# Patient Record
Sex: Female | Born: 1960 | Race: White | Hispanic: No | Marital: Married | Smoking: Never smoker
Health system: Southern US, Community
[De-identification: ages and names within clinical notes are randomized; demographics above are authoritative.]

## PROBLEM LIST (undated history)

## (undated) DIAGNOSIS — T7840XA Allergy, unspecified, initial encounter: Secondary | ICD-10-CM

## (undated) DIAGNOSIS — Z973 Presence of spectacles and contact lenses: Secondary | ICD-10-CM

## (undated) DIAGNOSIS — E079 Disorder of thyroid, unspecified: Secondary | ICD-10-CM

## (undated) DIAGNOSIS — E785 Hyperlipidemia, unspecified: Secondary | ICD-10-CM

## (undated) DIAGNOSIS — E039 Hypothyroidism, unspecified: Secondary | ICD-10-CM

## (undated) DIAGNOSIS — E119 Type 2 diabetes mellitus without complications: Secondary | ICD-10-CM

## (undated) DIAGNOSIS — I10 Essential (primary) hypertension: Secondary | ICD-10-CM

## (undated) HISTORY — DX: Hyperlipidemia, unspecified: E78.5

## (undated) HISTORY — PX: CHOLECYSTECTOMY: SHX55

## (undated) HISTORY — DX: Disorder of thyroid, unspecified: E07.9

## (undated) HISTORY — DX: Allergy, unspecified, initial encounter: T78.40XA

## (undated) HISTORY — PX: DILATION AND CURETTAGE OF UTERUS: SHX78

## (undated) HISTORY — PX: OTHER SURGICAL HISTORY: SHX169

## (undated) HISTORY — DX: Essential (primary) hypertension: I10

## (undated) HISTORY — DX: Type 2 diabetes mellitus without complications: E11.9

---

## 2005-08-13 ENCOUNTER — Ambulatory Visit (HOSPITAL_COMMUNITY): Admission: RE | Admit: 2005-08-13 | Discharge: 2005-08-13 | Payer: Self-pay | Admitting: Surgery

## 2005-10-15 ENCOUNTER — Ambulatory Visit (HOSPITAL_COMMUNITY): Admission: RE | Admit: 2005-10-15 | Discharge: 2005-10-16 | Payer: Self-pay | Admitting: Surgery

## 2006-05-01 IMAGING — NM NM PARATHYOID PLANAR
3 series · 18 of 18 positions shown · non-contrast
Comparison: None.

CLINICAL DATA: Hyperthyroidism.  
 NM PARATHYROID SCINTIGRAPHY AND SPECT IMAGING ? 08/13/05:
TECHNIQUE: Following intravenous administration of radiopharmaceutical, early and 2-hour delayed planar images were obtained in the anterior projection.  Delayed triplanar SPECT images were also obtained at 2 hours.  
 Radiopharmaceutical:  24.6 mCi [id number] sestamibi IV.

[Series 1: (hospital) non-circular ect · 4.7mm · 4.75mm/px · 6 of 91 frames shown (1 of 3)]
[frame 8/91]
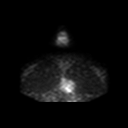
[frame 23/91]
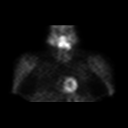
[frame 38/91]
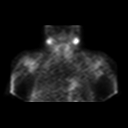
[frame 53/91]
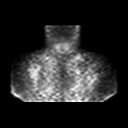
[frame 68/91]
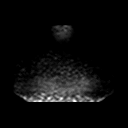
[frame 84/91]
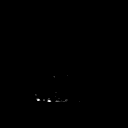

[Series 1: (hospital) non-circular ect · 4.7mm · 4.75mm/px · 6 of 91 frames shown (2 of 3)]
[frame 8/91]
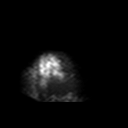
[frame 23/91]
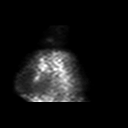
[frame 38/91]
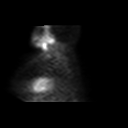
[frame 53/91]
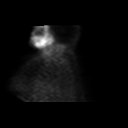
[frame 68/91]
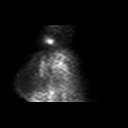
[frame 84/91]
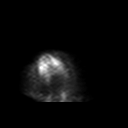

[Series 1: (hospital) non-circular ect · 4.7mm · 4.75mm/px · 6 of 91 frames shown (3 of 3)]
[frame 8/91]
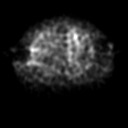
[frame 23/91]
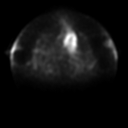
[frame 38/91]
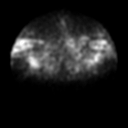
[frame 53/91]
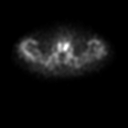
[frame 68/91]
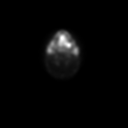
[frame 84/91]
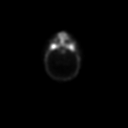

[18 of 18 positions shown; findings below may reference images not displayed]

FINDINGS: Delayed triplanar SPECT images show a persistent focus of increased radiotracer uptake corresponding to the lower pole of the left lobe of the thyroid gland.  The finding is consistent with a parathyroid adenoma.
IMPRESSION: Parathyroid adenoma localizes to the lower pole of the left lobe of the thyroid gland.

## 2012-10-14 ENCOUNTER — Other Ambulatory Visit (HOSPITAL_COMMUNITY)
Admission: RE | Admit: 2012-10-14 | Discharge: 2012-10-14 | Disposition: A | Discharge status: Home / Self Care | Payer: PRIVATE HEALTH INSURANCE | Source: Ambulatory Visit | Attending: Obstetrics and Gynecology | Admitting: Obstetrics and Gynecology

## 2012-10-14 ENCOUNTER — Other Ambulatory Visit: Payer: Self-pay | Admitting: Obstetrics and Gynecology

## 2012-10-14 DIAGNOSIS — Z1151 Encounter for screening for human papillomavirus (HPV): Secondary | ICD-10-CM | POA: Insufficient documentation

## 2012-10-14 DIAGNOSIS — Z01419 Encounter for gynecological examination (general) (routine) without abnormal findings: Secondary | ICD-10-CM | POA: Insufficient documentation

## 2016-12-31 ENCOUNTER — Other Ambulatory Visit: Payer: Self-pay | Admitting: Obstetrics and Gynecology

## 2016-12-31 ENCOUNTER — Ambulatory Visit (INDEPENDENT_AMBULATORY_CARE_PROVIDER_SITE_OTHER): Payer: PRIVATE HEALTH INSURANCE | Admitting: Endocrinology

## 2016-12-31 ENCOUNTER — Encounter: Payer: Self-pay | Admitting: Endocrinology

## 2016-12-31 ENCOUNTER — Other Ambulatory Visit (HOSPITAL_COMMUNITY)
Admission: RE | Admit: 2016-12-31 | Discharge: 2016-12-31 | Disposition: A | Discharge status: Home / Self Care | Payer: PRIVATE HEALTH INSURANCE | Source: Ambulatory Visit | Attending: Obstetrics and Gynecology | Admitting: Obstetrics and Gynecology

## 2016-12-31 VITALS — BP 138/76 | HR 101 | Ht 63.0 in | Wt 241.4 lb

## 2016-12-31 DIAGNOSIS — E1165 Type 2 diabetes mellitus with hyperglycemia: Secondary | ICD-10-CM

## 2016-12-31 DIAGNOSIS — Z794 Long term (current) use of insulin: Secondary | ICD-10-CM | POA: Insufficient documentation

## 2016-12-31 DIAGNOSIS — Z124 Encounter for screening for malignant neoplasm of cervix: Secondary | ICD-10-CM | POA: Diagnosis present

## 2016-12-31 LAB — HM DIABETES EYE EXAM

## 2016-12-31 NOTE — Progress Notes (Signed)
Patient ID: Kara Dawson, female   DOB: 08/13/1960, 56 y.o.   MRN: [id number]          Reason for Appointment: Consultation for Type 2 Diabetes  Referring physician: Marisue Humble   History of Present Illness:          Date of diagnosis of type 2 diabetes mellitus: 2004        Background history:   She has been on various oral hyperglycemic agents including metformin, Janumet and Amaryl in the past Apparently her blood sugars were fairly well controlled until 2012 when her A1c was 7.5 and subsequently it has been mostly higher above 8% She has tried other agents including Invokana and Farxiga as well as Bydureon However her A1c has not improved with these changes She apparently had not lost weight with Bydureon and blood sugars were not better  Recent history:       Her most recent A1c was 9.3 in April  Non-insulin hypoglycemic drugs the patient is taking are: Janumet 50/1000 twice a day, Amaryl 4 mg twice a day, Farxiga 10 mg daily  Current management, blood sugar patterns and problems identified:  She does not check her sugar very regularly and did not bring her meter  She thinks that her blood sugars are the highest fasting, mostly 160-180 but occasionally over 200  Not clear if she checks readings after meals much but she does not think they are more than about 140 at night before bedtime  Over the last 2 months or so she has been eating out frequently and not watching her diet  Also has not been doing any walking for exercise except occasionally  She has had difficulty losing weight and has been about the same for some time, she has been recommended bariatric surgery but she does not want to consider this           Side effects from medications have been: None  Compliance with the medical regimen: Fair  Glucose monitoring:  done 0-1  times a day         Glucometer: One Touch.       Blood Glucose readings by recall   PREMEAL Breakfast Lunch Dinner Bedtime  Overall     Glucose range: 160-209 100 100 140   Median:        POST-MEAL PC Breakfast PC Lunch PC Dinner  Glucose range:     Median:      Self-care: The diet that the patient has been following is: tries to limit Sugar intake .  Recently eating out more, eating out 3-5 times a week at diners and sometimes seafood restaurants     Typical meal intake: Breakfast is protein shake or egg and toast               Dietician visit, most recent: 2013               Exercise:  some walking  Weight history: Previous range 180-281  Wt Readings from Last 3 Encounters:  12/31/16 241 lb 6.4 oz (109.5 kg)    Glycemic control:   No results found for: HGBA1C No results found for: GLUF, MICROALBUR, LDLCALC, CREATININE No results found for: MICRALBCREAT  No results found for: FRUCTOSAMINE   Other active problems: See review of systems   Allergies as of 12/31/2016      Reactions   Penicillins Rash      Medication List       Accurate as of 12/31/16  1:07 PM. Always use your most recent med list.          aspirin EC 81 MG tablet Take 81 mg by mouth daily.   atorvastatin 40 MG tablet Commonly known as:  LIPITOR Take 40 mg by mouth daily.   FARXIGA 10 MG Tabs tablet Generic drug:  dapagliflozin propanediol Take 1 Tablet by mouth once daily FOR diabetes   glimepiride 4 MG tablet Commonly known as:  AMARYL Take 1 Tablet by mouth 2 times a day WITH FOOD FOR diabetes   JANUMET 50-1000 MG tablet Generic drug:  sitaGLIPtin-metformin Take 1 Tablet by mouth 2 times a day FOR diabetes   L-Lysine 500 MG Caps Take by mouth. Takes one daily   levothyroxine 50 MCG tablet Commonly known as:  SYNTHROID, LEVOTHROID Take 1 Tablet by mouth every morning ON an EMPTY stomach   lisinopril 10 MG tablet Commonly known as:  PRINIVIL,ZESTRIL Take 10 mg by mouth daily.   magnesium gluconate 500 MG tablet Commonly known as:  MAGONATE Take 500 mg by mouth daily.   MULTIPLE VITAMIN PO Take by mouth.  Takes one vitamin daily   Vitamin D3 1000 units Caps Take by mouth. Takes one daily       Allergies:  Allergies  Allergen Reactions  . Penicillins Rash    Past Medical History:  Diagnosis Date  . Allergy   . Diabetes mellitus without complication ([place])   . Hyperlipidemia   . Hypertension   . Thyroid disease     No past surgical history on file.  Family History  Problem Relation Age of Onset  . Diabetes Mother   . Diabetes Father   . Heart disease Father     Social History:  reports that she has never smoked. She has never used smokeless tobacco. Her alcohol and drug histories are not on file.   Review of Systems  Constitutional: Negative for reduced appetite.       Has lost about 4 pounds since April  HENT: Negative for trouble swallowing.   Eyes: Negative for blurred vision.  Respiratory: Negative for shortness of breath.   Cardiovascular: Negative for chest pain and leg swelling.  Gastrointestinal: Positive for nausea. Negative for constipation and diarrhea.       Sometimes will have transient nausea in the mornings before eating  Endocrine: Negative for fatigue.       She has been on levothyroxine since 2007 when she had parathyroidectomy for hyperparathyroidism  Genitourinary: Negative for frequency.       Has to get up at night for urination only about once  Musculoskeletal: Negative for back pain.       She has been running down her right leg from the buttock area to the foot on sitting in certain positions and sometimes lying down but not on walking  Skin: Negative for rash.  Neurological: Negative for numbness and tingling.     Lipid history: Last LDL was 92, she has been treated with Lipitor   No results found for: CHOL, HDL, LDLCALC, LDLDIRECT, TRIG, CHOLHDL         Hypertension: Mild and has been treated with lisinopril by PCP  Most recent eye exam was 5/17  Most recent foot exam: 12/2016     LABS: Last TSH in April was 2.2  No visits  with results within 1 Week(s) from this visit.  Latest known visit with results is:  No results found for any previous visit.    Physical Examination:  BP 138/76   Pulse (!) 101   Ht '5\' 3"'  (1.6 m)   Wt 241 lb 6.4 oz (109.5 kg)   SpO2 94%   BMI 42.76 kg/m   GENERAL:         Patient has generalized obesity.   HEENT:         Eye exam shows normal external appearance. Fundus exam shows no retinopathy.  Oral exam shows normal mucosa .  NECK:   There is no lymphadenopathy Thyroid is not enlarged and no nodules felt.  Carotids are normal to palpation and no bruit heard LUNGS:         Chest is symmetrical. Lungs are clear to auscultation.Marland Kitchen   HEART:         Heart sounds:  S1 and S2 are normal. No murmur or click heard., no S3 or S4.   ABDOMEN:   There is no distention present. Liver and spleen are not palpable. No other mass or tenderness present.   NEUROLOGICAL:   Ankle jerks are absent bilaterally.    Diabetic Foot Exam - Simple   Simple Foot Form Diabetic Foot exam was performed with the following findings:  Yes   Visual Inspection No deformities, no ulcerations, no other skin breakdown bilaterally:  Yes Sensation Testing Intact to touch and monofilament testing bilaterally:  Yes Pulse Check See comments:  Yes Comments Decreased pulses on the right and absent on the left            Vibration sense is Mildly reduced in distal first toes. MUSCULOSKELETAL:  There is no swelling or deformity of the peripheral joints. Spine is normal to inspection.   EXTREMITIES:     There is no edema. No skin lesions present.Marland Kitchen SKIN:       No rash or lesions of concern.        ASSESSMENT:  Diabetes type 2, uncontrolled with A1c recently consistently over 9% BMI 43 and patient has difficulty losing weight Patient reports only mildly increased blood sugars fasting and not at other times and unlikely is checking her blood sugar as regularly as needed She probably is insulin deficient because of her  progressive hyperglycemia which has not improved over the last 6 years with various agents including Bydureon, [place] and [place] More recently has not been doing well with her diet or exercise regimen also  Complications of diabetes: None evident  Hyperlipidemia, hypertension, hypothyroidism: Well controlled and followed by PCP   PLAN:     Although she most likely needs basal insulin to improve her control she may be able to get better control and weight loss with trying a GLP-1 drug.  Since Ozempic will be the most effective GLP-1 drug she will be tried on this using the starter kit with the co-pay card provided by the company  Showed her how to use the [facility] pen today and given her the patient information and co-pay card  She will start with 0.25 mg weekly and then go to 0.5 after 2 injections  Discussed actions and benefits of GLP-1 drugs, side effects mostly related to nausea initially  When she finishes her Janumet she can switch to metformin 2000 mg a day  Follow-up in 6 weeks  If she is not improving with her blood sugar and fructosamine levels at that time she will be given basal insulin  She will be scheduled to see the dietitian for meal planning  She will start regular walking for exercise up to 30 minutes a day  More consistent glucose monitoring at various times including after meals and bring monitor for download on each visit  Patient Instructions  Take Ozempic 0.56m weekly for 2 weeks then 0.523mweekly  Check blood sugars on waking up  3/7 days  Also check blood sugars about 2 hours after a meal and do this after different meals by rotation  Recommended blood sugar levels on waking up is 90-130 and about 2 hours after meal is 130-160  Please bring your blood sugar monitor to each visit, thank you     Consultation note has been sent to the referring physician  [facility]/14/2018, 1:07 PM   Note: This office note was prepared with Dragon voice  recognition system technology. Any transcriptional errors that result from this process are unintentional.

## 2016-12-31 NOTE — Patient Instructions (Signed)
Take Ozempic 0.25mg  weekly for 2 weeks then 0.56mg  weekly  Check blood sugars on waking up  3/7 days  Also check blood sugars about 2 hours after a meal and do this after different meals by rotation  Recommended blood sugar levels on waking up is 90-130 and about 2 hours after meal is 130-160  Please bring your blood sugar monitor to each visit, thank you

## 2017-01-06 LAB — CYTOLOGY - PAP
Diagnosis: NEGATIVE
HPV: NOT DETECTED

## 2017-01-14 ENCOUNTER — Telehealth: Payer: Self-pay | Admitting: Family Medicine

## 2017-01-14 NOTE — Telephone Encounter (Signed)
Pharm tech calling to get PA for Farxiga 10mg .  Thank you,  -LL

## 2017-01-18 ENCOUNTER — Other Ambulatory Visit: Payer: Self-pay

## 2017-01-18 MED ORDER — FARXIGA 10 MG PO TABS: ORAL_TABLET | ORAL | 1 refills | Status: DC

## 2017-01-18 NOTE — Telephone Encounter (Signed)
Can you do this one? Thank you! 

## 2017-01-19 ENCOUNTER — Other Ambulatory Visit: Payer: Self-pay

## 2017-01-19 MED ORDER — METFORMIN HCL 1000 MG PO TABS: 1000.0000 mg | ORAL_TABLET | Freq: Two times a day (BID) | ORAL | 3 refills | Status: DC

## 2017-01-19 MED ORDER — CANAGLIFLOZIN 300 MG PO TABS: 300.0000 mg | ORAL_TABLET | Freq: Every day | ORAL | 4 refills | Status: DC

## 2017-01-19 NOTE — Telephone Encounter (Signed)
Spoke to pharmacy and they are faxing PA form

## 2017-01-19 NOTE — Telephone Encounter (Signed)
Patient husband returning missed call from Donnie CoffinLisa W RE PA for [place].

## 2017-01-19 NOTE — Telephone Encounter (Signed)
Husband called back stated patient has 2 days worth of Janumet and than she will be out would like to get metformin prescribed patient husband stated they were supposed to call back and tell the doctor when she was almost finished with the janumet so that a metformin prescription could be placed please advise

## 2017-01-19 NOTE — Telephone Encounter (Signed)
Spoke with the patients husband and he stated the pharmacy ran insurance to see what was covered and Invokana was the covered medication-spoke to Dr. Lucianne MussKumar and he stated to switch the prescription to Invokana 300mg  daily- I did this, ordered the prescription, and notified the patients husband when the order was placed

## 2017-01-19 NOTE — Telephone Encounter (Signed)
Instead of Janumet she will now take metformin 1 g twice a day along [facility] the [facility]

## 2017-01-19 NOTE — Telephone Encounter (Signed)
Spoke to Kara Dawson and I informed him of the new prescription of Metformin and that I already ordered it- he stated an understanding

## 2017-01-25 ENCOUNTER — Telehealth: Payer: Self-pay

## 2017-01-25 ENCOUNTER — Telehealth: Payer: Self-pay | Admitting: Endocrinology

## 2017-01-25 NOTE — Telephone Encounter (Signed)
Patient's husband called to speak to Misty StanleyLisa to advise that the  medication has already been picked up and taken by the patient. (he did not state what medication before the call ended). Call patient's husband as soon as possible to advise.

## 2017-01-25 NOTE — Telephone Encounter (Signed)
Left vm requesting a call back to discus the note below

## 2017-01-25 NOTE — Telephone Encounter (Signed)
Left vm requesting the patient call insurance company and see if Invokana or [place] PepperJardiance are covered per Dr. Remus Blakekumar's request

## 2017-02-01 ENCOUNTER — Telehealth: Payer: Self-pay

## 2017-02-01 NOTE — Telephone Encounter (Signed)
I was able to verify from the pharmacy that the patient does not need PA for farxiga she is taking invokana

## 2017-02-01 NOTE — Telephone Encounter (Signed)
Spoke with husband and everything was resolved for this patient she is taking [place]

## 2017-02-04 ENCOUNTER — Telehealth: Payer: Self-pay | Admitting: Endocrinology

## 2017-02-04 ENCOUNTER — Other Ambulatory Visit: Payer: Self-pay

## 2017-02-04 ENCOUNTER — Other Ambulatory Visit: Payer: Self-pay | Admitting: Endocrinology

## 2017-02-04 MED ORDER — SEMAGLUTIDE(0.25 OR 0.5MG/DOS) 2 MG/1.5ML ~~LOC~~ SOPN: 0.5000 mg | PEN_INJECTOR | SUBCUTANEOUS | 3 refills | Status: DC

## 2017-02-04 NOTE — Telephone Encounter (Signed)
Please prescribe her the Ozempic per your instructions. Thanks!

## 2017-02-04 NOTE — Telephone Encounter (Signed)
Routing to you °

## 2017-02-04 NOTE — Telephone Encounter (Signed)
**  Remind patient they can make refill requests via MyChart**  Medication refill request (Name & Dosage):  [facility]  [facility]erred [facility] (Name & [facility]  [facility] [place] [address] [phone] (Phone) [phone] (Fax)   Other comments (if applicable):  Patient was given ozempic sample in June (takes weekly injections), finished the last one this morning. Patient takes ozempic each Thursday and her next appointment is next Friday. Is it possible to have rx called in before her appointment . Please advise so she can get the discount.

## 2017-02-11 NOTE — Progress Notes (Deleted)
Patient ID: Kara Dawson, female   DOB: 1960-07-29, 57 y.o.   MRN: [id number]          Reason for Appointment: Consultation for Type 2 Diabetes  Referring physician: Marisue Humble   History of Present Illness:          Date of diagnosis of type 2 diabetes mellitus: 2004        Background history:   She has been on various oral hyperglycemic agents including metformin, Janumet and Amaryl in the past Apparently her blood sugars were fairly well controlled until 2012 when her A1c was 7.5 and subsequently it has been mostly higher above 8% She has tried other agents including Invokana and Farxiga as well as Bydureon However her A1c has not improved with these changes She apparently had not lost weight with Bydureon and blood sugars were not better  Recent history:       Her most recent A1c was 9.3 in April  Non-insulin hypoglycemic drugs the patient is taking are: Janumet 50/1000 twice a day, Amaryl 4 mg twice a day, Farxiga 10 mg daily  Current management, blood sugar patterns and problems identified:  She does not check her sugar very regularly and did not bring her meter  She thinks that her blood sugars are the highest fasting, mostly 160-180 but occasionally over 200  Not clear if she checks readings after meals much but she does not think they are more than about 140 at night before bedtime  Over the last 2 months or so she has been eating out frequently and not watching her diet  Also has not been doing any walking for exercise except occasionally  She has had difficulty losing weight and has been about the same for some time, she has been recommended bariatric surgery but she does not want to consider this           Side effects from medications have been: None  Compliance with the medical regimen: Fair  Glucose monitoring:  done 0-1  times a day         Glucometer: One Touch.       Blood Glucose readings by recall   PREMEAL Breakfast Lunch Dinner Bedtime  Overall     Glucose range: 160-209 100 100 140   Median:        POST-MEAL PC Breakfast PC Lunch PC Dinner  Glucose range:     Median:      Self-care: The diet that the patient has been following is: tries to limit Sugar intake .  Recently eating out more, eating out 3-5 times a week at diners and sometimes seafood restaurants     Typical meal intake: Breakfast is protein shake or egg and toast               Dietician visit, most recent: 2013               Exercise:  some walking  Weight history: Previous range 180-281  Wt Readings from Last 3 Encounters:  12/31/16 241 lb 6.4 oz (109.5 kg)    Glycemic control:   No results found for: HGBA1C No results found for: GLUF, MICROALBUR, LDLCALC, CREATININE No results found for: MICRALBCREAT  No results found for: FRUCTOSAMINE   Other active problems: See review of systems   Allergies as of 02/12/2017      Reactions   Penicillins Rash      Medication List       Accurate as of 02/11/17  9:24 PM. Always use your most recent med list.          aspirin EC 81 MG tablet Take 81 mg by mouth daily.   atorvastatin 40 MG tablet Commonly known as:  LIPITOR Take 40 mg by mouth daily.   canagliflozin 300 MG Tabs tablet Commonly known as:  INVOKANA Take 1 tablet (300 mg total) by mouth daily before breakfast.   FARXIGA 10 MG Tabs tablet Generic drug:  dapagliflozin propanediol Take 1 Tablet by mouth once daily FOR diabetes   glimepiride 4 MG tablet Commonly known as:  AMARYL Take 1 Tablet by mouth 2 times a day WITH FOOD FOR diabetes   JANUMET 50-1000 MG tablet Generic drug:  sitaGLIPtin-metformin Take 1 Tablet by mouth 2 times a day FOR diabetes   L-Lysine 500 MG Caps Take by mouth. Takes one daily   levothyroxine 50 MCG tablet Commonly known as:  SYNTHROID, LEVOTHROID Take 1 Tablet by mouth every morning ON an EMPTY stomach   lisinopril 10 MG tablet Commonly known as:  PRINIVIL,ZESTRIL Take 10 mg by mouth daily.    magnesium gluconate 500 MG tablet Commonly known as:  MAGONATE Take 500 mg by mouth daily.   metFORMIN 1000 MG tablet Commonly known as:  GLUCOPHAGE Take 1 tablet (1,000 mg total) by mouth 2 (two) times daily with a meal.   MULTIPLE VITAMIN PO Take by mouth. Takes one vitamin daily   Semaglutide 0.25 or 0.5 MG/DOSE Sopn Commonly known as:  OZEMPIC Inject 0.5 mg into the skin once a week.   Vitamin D3 1000 units Caps Take by mouth. Takes one daily       Allergies:  Allergies  Allergen Reactions  . Penicillins Rash    Past Medical History:  Diagnosis Date  . Allergy   . Diabetes mellitus without complication ([place])   . Hyperlipidemia   . Hypertension   . Thyroid disease     No past surgical history on file.  Family History  Problem Relation Age of Onset  . Diabetes Mother   . Diabetes Father   . Heart disease Father     Social History:  reports that she has never smoked. She has never used smokeless tobacco. Her alcohol and drug histories are not on file.   Review of Systems  Constitutional: Negative for reduced appetite.       Has lost about 4 pounds since April  HENT: Negative for trouble swallowing.   Eyes: Negative for blurred vision.  Respiratory: Negative for shortness of breath.   Cardiovascular: Negative for chest pain and leg swelling.  Gastrointestinal: Positive for nausea. Negative for constipation and diarrhea.       Sometimes will have transient nausea in the mornings before eating  Endocrine: Negative for fatigue.       She has been on levothyroxine since 2007 when she had parathyroidectomy for hyperparathyroidism  Genitourinary: Negative for frequency.       Has to get up at night for urination only about once  Musculoskeletal: Negative for back pain.       She has been running down her right leg from the buttock area to the foot on sitting in certain positions and sometimes lying down but not on walking  Skin: Negative for rash.   Neurological: Negative for numbness and tingling.     Lipid history: Last LDL was 92, she has been treated with Lipitor   No results found for: CHOL, HDL, LDLCALC, LDLDIRECT, TRIG, CHOLHDL  Hypertension: Mild and has been treated with lisinopril by PCP  Most recent eye exam was 5/17  Most recent foot exam: 12/2016     LABS: Last TSH in April was 2.2  No visits with results within 1 Week(s) from this visit.  Latest known visit with results is:  No results found for any previous visit.    Physical Examination:  There were no vitals taken for this visit.  GENERAL:         Patient has generalized obesity.   HEENT:         Eye exam shows normal external appearance. Fundus exam shows no retinopathy.  Oral exam shows normal mucosa .  NECK:   There is no lymphadenopathy Thyroid is not enlarged and no nodules felt.  Carotids are normal to palpation and no bruit heard LUNGS:         Chest is symmetrical. Lungs are clear to auscultation.Marland Kitchen   HEART:         Heart sounds:  S1 and S2 are normal. No murmur or click heard., no S3 or S4.   ABDOMEN:   There is no distention present. Liver and spleen are not palpable. No other mass or tenderness present.   NEUROLOGICAL:   Ankle jerks are absent bilaterally.    Diabetic Foot Exam - Simple   No data filed             Vibration sense is Mildly reduced in distal first toes. MUSCULOSKELETAL:  There is no swelling or deformity of the peripheral joints. Spine is normal to inspection.   EXTREMITIES:     There is no edema. No skin lesions present.Marland Kitchen SKIN:       No rash or lesions of concern.        ASSESSMENT:  Diabetes type 2, uncontrolled with A1c recently consistently over 9% BMI 43 and patient has difficulty losing weight Patient reports only mildly increased blood sugars fasting and not at other times and unlikely is checking her blood sugar as regularly as needed She probably is insulin deficient because of her progressive  hyperglycemia which has not improved over the last 6 years with various agents including Bydureon, [place] and [place] More recently has not been doing well with her diet or exercise regimen also  Complications of diabetes: None evident  Hyperlipidemia, hypertension, hypothyroidism: Well controlled and followed by PCP   PLAN:     Although she most likely needs basal insulin to improve her control she may be able to get better control and weight loss with trying a GLP-1 drug.  Since Ozempic will be the most effective GLP-1 drug she will be tried on this using the starter kit with the co-pay card provided by the company  Showed her how to use the [facility] pen today and given her the patient information and co-pay card  She will start with 0.25 mg weekly and then go to 0.5 after 2 injections  Discussed actions and benefits of GLP-1 drugs, side effects mostly related to nausea initially  When she finishes her Janumet she can switch to metformin 2000 mg a day  Follow-up in 6 weeks  If she is not improving with her blood sugar and fructosamine levels at that time she will be given basal insulin  She will be scheduled to see the dietitian for meal planning  She will start regular walking for exercise up to 30 minutes a day  More consistent glucose monitoring at various times including after meals and  bring monitor for download on each visit  There are no Patient Instructions on file for this visit.   Consultation note has been sent to the referring physician  [facility] 02/11/2017, 9:24 PM   Note: This office note was prepared with Dragon voice recognition system technology. Any transcriptional errors that result from this process are unintentional.

## 2017-02-12 ENCOUNTER — Ambulatory Visit: Payer: PRIVATE HEALTH INSURANCE | Admitting: Endocrinology

## 2017-02-12 ENCOUNTER — Ambulatory Visit (INDEPENDENT_AMBULATORY_CARE_PROVIDER_SITE_OTHER): Payer: PRIVATE HEALTH INSURANCE | Admitting: Endocrinology

## 2017-02-12 ENCOUNTER — Encounter: Payer: Self-pay | Admitting: Endocrinology

## 2017-02-12 ENCOUNTER — Telehealth: Payer: Self-pay | Admitting: Endocrinology

## 2017-02-12 VITALS — BP 116/76 | HR 94 | Ht 63.0 in | Wt 234.4 lb

## 2017-02-12 DIAGNOSIS — E1165 Type 2 diabetes mellitus with hyperglycemia: Secondary | ICD-10-CM

## 2017-02-12 DIAGNOSIS — I10 Essential (primary) hypertension: Secondary | ICD-10-CM

## 2017-02-12 NOTE — Progress Notes (Signed)
Patient ID: Kara RanaJanet C Brockman, female   DOB: 18-Dec-1960, 56 y.o.   MRN: [id number]          Reason for Appointment:  for Type 2 Diabetes  Referring physician: Manus GunningEhinger   History of Present Illness:          Date of diagnosis of type 2 diabetes mellitus: 2004        Background history:   She has been on various oral hyperglycemic agents including metformin, Janumet and Amaryl in the past Apparently her blood sugars were fairly well controlled until 2012 when her A1c was 7.5 and subsequently it has been mostly higher above 8% She has tried other agents including Invokana and Farxiga as well as Bydureon However her A1c has not improved with these changes She apparently had not lost weight with Bydureon and blood sugars were not better  Recent history:       Her most recent A1c was 9.3 in April  Non-insulin hypoglycemic drugs the patient is taking are: Metformin 1000 twice a day, Amaryl 4 mg twice a day, Invokana 300 mg daily, Ozempic 0.5 mg weekly  Current management, blood sugar patterns and problems identified:  She was able to start Ozempic with the sample but has not been able to get it approved by insurance and has not taken the dose this week and last regard only 0.25 mg out of the pen  She had nausea for the first 2 weeks and this is getting better  She has cut back on portions and has lost weight  However blood sugars from her diary does not look much better than before although did not have her previous records  She says she has had a lot of stress because her mother illness  FASTING readings are high persistently   Has not done many readings after meals especially supper  Also her Marcelline DeistFarxiga was changed to Invokana 300 mg daily for better efficacy        Side effects from medications have been: None  Compliance with the medical regimen: Fair  Glucose monitoring:  done 0-1  times a day         Glucometer: One Touch.       Blood Glucose readings by home diary:  Mean  values apply above for all meters except median for One Touch  PRE-MEAL Fasting Lunch Dinner Bedtime Overall  Glucose range: 150-195  119-161 180   Mean/median:         Self-care: The diet that the patient has been following is: tries to limit Sugar intake .  Recently eating out more, eating out 3-5 times a week at diners and sometimes seafood restaurants     Typical meal intake: Breakfast is protein shake or egg and toast               Dietician visit, most recent: 2013               Exercise:  some walking  Weight history: Previous range 180-281  Wt Readings from Last 3 Encounters:  02/12/17 234 lb 6.4 oz (106.3 kg)  12/31/16 241 lb 6.4 oz (109.5 kg)    Glycemic control:   No results found for: HGBA1C No results found for: GLUF, MICROALBUR, LDLCALC, CREATININE No results found for: MICRALBCREAT  No results found for: FRUCTOSAMINE   Other active problems: See review of systems   Allergies as of 02/12/2017      Reactions   Penicillins Rash  Medication List       Accurate as of 02/12/17  2:21 PM. Always use your most recent med list.          aspirin EC 81 MG tablet Take 81 mg by mouth daily.   atorvastatin 40 MG tablet Commonly known as:  LIPITOR Take 40 mg by mouth daily.   canagliflozin 300 MG Tabs tablet Commonly known as:  INVOKANA Take 1 tablet (300 mg total) by mouth daily before breakfast.   glimepiride 4 MG tablet Commonly known as:  AMARYL Take 1 Tablet by mouth 2 times a day WITH FOOD FOR diabetes   L-Lysine 500 MG Caps Take by mouth. Takes one daily   levothyroxine 50 MCG tablet Commonly known as:  SYNTHROID, LEVOTHROID Take 1 Tablet by mouth every morning ON an EMPTY stomach   lisinopril 10 MG tablet Commonly known as:  PRINIVIL,ZESTRIL Take 10 mg by mouth daily.   magnesium gluconate 500 MG tablet Commonly known as:  MAGONATE Take 500 mg by mouth daily.   metFORMIN 1000 MG tablet Commonly known as:  GLUCOPHAGE Take 1 tablet  (1,000 mg total) by mouth 2 (two) times daily with a meal.   MULTIPLE VITAMIN PO Take by mouth. Takes one vitamin daily   Semaglutide 0.25 or 0.5 MG/DOSE Sopn Commonly known as:  OZEMPIC Inject 0.5 mg into the skin once a week.   Vitamin D3 1000 units Caps Take by mouth. Takes one daily       Allergies:  Allergies  Allergen Reactions  . Penicillins Rash    Past Medical History:  Diagnosis Date  . Allergy   . Diabetes mellitus without complication ([facility])   . Hyperlipidemia   . Hypertension   . Thyroid disease     No past surgical history on file.  Family History  Problem Relation Age of Onset  . Diabetes Mother   . Diabetes Father   . Heart disease Father     Social History:  reports that she has never smoked. She has never used smokeless tobacco. Her alcohol and drug histories are not on file.   Review of Systems   Lipid history: Last LDL was 92, she has been treated with Lipitor   No results found for: CHOL, HDL, LDLCALC, LDLDIRECT, TRIG, CHOLHDL         Hypertension: Mild and has been treated with lisinopril by PCP  Most recent eye exam was 5/17  Most recent foot exam: 12/2016     LABS: Last TSH in April was 2.2  No visits with results within 1 Week(s) from this visit.  Latest known visit with results is:  No results found for any previous visit.    Physical Examination:  BP 116/76   Pulse 94   Ht 5\' 3"  (1.6 m)   Wt 234 lb 6.4 oz (106.3 kg)   SpO2 94%   BMI 41.52 kg/m    ASSESSMENT:  Diabetes type 2, uncontrolled with Obesity  See history of present illness for detailed discussion of current diabetes management, blood sugar patterns and problems identified  She is on multiple drugs and has difficulty with control and last A1c over 9% dinner Although blood sugars may be slightly better with adding Ozempic she still has inadequate control and blood sugars are at least averaging 160 in the morning and somewhat higher after meals She has  lost weight She thinks some of her high readings are from stress also   PLAN:     She will  need to add a basal insulin and continue a GLP-1  Currently difficult to know what to prescribe since unable to get a list of her approved drugs on the insurance  Has seen the patient instructions she has been given directions on how to do various regimens that are recently available with either a GLP-1 drug and insulin separately or in combination  Since her husband has been able to do Victoza she will get his help to learn how to do the injection  Given her a flowsheet to help titrate basal insulin or either Xultophy or Soliqua whenever available  She also needs to get her own individual monitor so that download this and get more information  Increase exercise as possible  Patient Instructions  Check cost/coverage of Victoza, Trulicity, compared to Ozempic  Also check cost of Tresiba, Toujeo and Lantus insulins  COMBINATION products that we may try if covered: [place] and Soliqua.  If starting VICTOZA:  Start 0.6 milligrams for the first couple of days and then 1.2 mg daily This will need to be combined with insulin, depending on what is covered as above  Basal  insulin: This insulin provides blood sugar control for up to 24 hours.   Start with 6 units at bedtime daily and increase by 2 units every 3 days until the waking up sugars are under 130. Then continue the same dose.  If blood sugar is under 90 for 2 days in a row, reduce the dose by 2 units.  Note that this insulin does not control the rise of blood sugar with meals  If able to start  Xultophy or Soliqua  combination to Rx U will start with 10 units daily and go up every 3 days as above also   Please check the cost of various brand name test strips and let us know which is your preferred brand   Counseling time on subjects discussed in assessment and plan sections is over 50% of today's 25 minute visit      Fantashia Shupert 02/12/2017, 2:21 PM   Note: This office note was prepared with Insurance underwriterDragon voice recognition system technology. Any transcriptional errors that result from this process are unintentional.

## 2017-02-12 NOTE — Patient Instructions (Addendum)
Check cost/coverage of Victoza, Trulicity, compared to Ozempic  Also check cost of Tresiba, Toujeo and Lantus insulins  COMBINATION products that we may try if covered: [place] and Soliqua.  If starting VICTOZA:  Start 0.6 milligrams for the first couple of days and then 1.2 mg daily This will need to be combined with insulin, depending on what is covered as above  Basal  insulin: This insulin provides blood sugar control for up to 24 hours.   Start with 6 units at bedtime daily and increase by 2 units every 3 days until the waking up sugars are under 130. Then continue the same dose.  If blood sugar is under 90 for 2 days in a row, reduce the dose by 2 units.  Note that this insulin does not control the rise of blood sugar with meals  If able to start  Xultophy or Soliqua  combination to Rx U will start with 10 units daily and go up every 3 days as above also   Please check the cost of various brand name test strips and let us know which is your preferred brand

## 2017-02-12 NOTE — Telephone Encounter (Signed)
See above

## 2017-02-12 NOTE — Telephone Encounter (Signed)
Please advise if okay to switch. Thank you!   

## 2017-02-12 NOTE — Telephone Encounter (Signed)
Semaglutide (OZEMPIC) 0.25 or 0.5 MG/DOSE SOPN is not covered and the SOLIQUA si covered.  [facility] [address] [phone] (Phone) [phone] (Fax)   Call pharmacy to advise as soon as possible, patient is at the pharmacy

## 2017-02-12 NOTE — Telephone Encounter (Signed)
Please send prescription for Soliqua, prescription to state 30 units daily.  Remind patient that she will start with 10 units and go up to units every 3 days as discussed.  She can also get a co-pay card from the [facility] printed up

## 2017-02-15 ENCOUNTER — Other Ambulatory Visit: Payer: Self-pay

## 2017-02-15 MED ORDER — INSULIN GLARGINE-LIXISENATIDE 100-33 UNT-MCG/ML ~~LOC~~ SOPN: 30.0000 [IU] | PEN_INJECTOR | Freq: Every day | SUBCUTANEOUS | 3 refills | Status: DC

## 2017-02-15 NOTE — Telephone Encounter (Signed)
Called patient and let him know the pharmacy is checking to see which medication is covered and will call us back to let us know.

## 2017-02-15 NOTE — Telephone Encounter (Signed)
Patient's husband Homer is calling to report that Lantus us covered by the patient's insurance.   They don't know what it is or how it's used. Please call back to discuss.  Thank you,  -LL

## 2017-02-15 NOTE — Telephone Encounter (Signed)
This has been ordered and patients husband has been notified of the instructions

## 2017-02-15 NOTE — Telephone Encounter (Signed)
[name] w/ [place] calling to report Kara ChambersSoliqua is covered but needs prior auth. Might be free for a year with a manufacturer coupon.  They are working on it now.  Thank you,  -LL

## 2017-02-15 NOTE — Telephone Encounter (Signed)
[facility] let me know that they are going to have to check with Riki RuskJeremy to see which medication is actually covered by insurance. They think it may be the Lantus instead of the [facility] and if so he will also need a copay card for the Lantus. Pharmacy is going to double check and call us back to let us know which prescription will be the best.

## 2017-02-15 NOTE — Telephone Encounter (Signed)
Called patient and he stated that the pharmacy has told him that they have not [facility] the [facility] yet although it has been sent. I let him know that I would call the pharmacy to make sure they received the prescription.

## 2017-02-17 NOTE — Telephone Encounter (Signed)
Patient was given a co-pay card for [facility] from the pharmacy- this is a manufacturer card and it lasts for 12 refills so this is resolved

## 2017-03-15 ENCOUNTER — Ambulatory Visit: Payer: PRIVATE HEALTH INSURANCE | Admitting: Endocrinology

## 2017-03-17 ENCOUNTER — Ambulatory Visit: Payer: PRIVATE HEALTH INSURANCE | Admitting: Endocrinology

## 2017-04-06 ENCOUNTER — Ambulatory Visit: Payer: PRIVATE HEALTH INSURANCE | Admitting: Endocrinology

## 2017-04-07 ENCOUNTER — Ambulatory Visit (INDEPENDENT_AMBULATORY_CARE_PROVIDER_SITE_OTHER): Payer: PRIVATE HEALTH INSURANCE | Admitting: Endocrinology

## 2017-04-07 VITALS — BP 108/60 | HR 80 | Ht 63.0 in | Wt 228.0 lb

## 2017-04-07 DIAGNOSIS — E1165 Type 2 diabetes mellitus with hyperglycemia: Secondary | ICD-10-CM | POA: Diagnosis not present

## 2017-04-07 LAB — POCT GLYCOSYLATED HEMOGLOBIN (HGB A1C): HEMOGLOBIN A1C: 7.5

## 2017-04-07 NOTE — Patient Instructions (Addendum)
Check blood sugars on waking up  4/7  Also check blood sugars about 2 hours after a meal and do this after different meals by rotation  Recommended blood sugar levels on waking up is 90-130 and about 2 hours after meal is 130-160  Please bring your blood sugar monitor to each visit, thank you  Stop lisinopril

## 2017-04-07 NOTE — Progress Notes (Signed)
Patient ID: Kara Dawson, female   DOB: 31-Jan-1961, 56 y.o.   MRN: [id number]          Reason for Appointment:  for Type 2 Diabetes  Referring physician: Manus Gunning   History of Present Illness:          Date of diagnosis of type 2 diabetes mellitus: 2004        Background history:   She has been on various oral hyperglycemic agents including metformin, Janumet and Amaryl in the past Apparently her blood sugars were fairly well controlled until 2012 when her A1c was 7.5 and subsequently it has been mostly higher above 8% She has tried other agents including Invokana and Farxiga as well as Bydureon However her A1c has not improved with these changes She apparently had not lost weight with Bydureon and blood sugars were not better  Recent history:       Her most recent A1c was 9.3 in April  Non-insulin hypoglycemic drugs the patient is taking are: Metformin 1000 twice a day, Amaryl 4 mg twice a day, Invokana 300 mg daily  SOLIQUA 27 units daily  Current management, blood sugar patterns and problems identified:  She was able to start Niger after her last visit in July since she could not get Ozempic covered by insurance  She did have no difficulties with the cost of this and is able to continue this long-term  She had nausea for the first few days but now this has resolved  Also she has been on her own working up on the dose of the injection starting with 15 units and for the last 2 weeks at least has been taking 27 units  She has had a couple of readings over 130 but most of them are looking fairly good in the last 3 readings were below 130  No hypoglycemia except she does feel a little excessively hungry mid morning  She has cut back on portions and has lost weight again, she thinks she can do little better with her weight but has difficulty being consistent because of taking care of her mother also  FASTING readings are as low as 105, previously were high persistently    Has not done many readings after meals especially supper       Side effects from medications have been: None  Compliance with the medical regimen: Fair  Glucose monitoring:  done 0-1  times a day         Glucometer:  Telecare   ?     Blood Glucose readings by home diary:  Mean values apply above for all meters except median for One Touch  PRE-MEAL Fasting Lunch Dinner Bedtime Overall  Glucose range: 105-150  113, 130  134   Mean/median:         Self-care: The diet that the patient has been following is: tries to limit Sugar intake .  Recently eating out more, eating out 3-5 times a week at diners and sometimes seafood restaurants     Typical meal intake: Breakfast is protein shake or egg and toast               Dietician visit, most recent: 2013               Exercise:  some walking  Weight history: Previous range 180-281  Wt Readings from Last 3 Encounters:  04/07/17 228 lb (103.4 kg)  02/12/17 234 lb 6.4 oz (106.3 kg)  12/31/16 241 lb 6.4 oz (  109.5 kg)    Glycemic control:    Lab Results  Component Value Date   HGBA1C 7.5 04/07/2017   No results found for: GLUF, MICROALBUR, LDLCALC, CREATININE No results found for: MICRALBCREAT  No results found for: FRUCTOSAMINE   Other active problems: See review of systems   Allergies as of 04/07/2017      Reactions   Penicillins Rash      Medication List       Accurate as of 04/07/17 10:29 AM. Always use your most recent med list.          aspirin EC 81 MG tablet Take 81 mg by mouth daily.   atorvastatin 40 MG tablet Commonly known as:  LIPITOR Take 40 mg by mouth daily.   canagliflozin 300 MG Tabs tablet Commonly known as:  INVOKANA Take 1 tablet (300 mg total) by mouth daily before breakfast.   glimepiride 4 MG tablet Commonly known as:  AMARYL Take 1 Tablet by mouth 2 times a day WITH FOOD FOR diabetes   Insulin Glargine-Lixisenatide 100-33 UNT-MCG/ML Sopn Commonly known as:  SOLIQUA Inject 30  Units into the skin daily.   L-Lysine 500 MG Caps Take by mouth. Takes one daily   levothyroxine 50 MCG tablet Commonly known as:  SYNTHROID, LEVOTHROID Take 1 Tablet by mouth every morning ON an EMPTY stomach   lisinopril 10 MG tablet Commonly known as:  PRINIVIL,ZESTRIL Take 10 mg by mouth daily.   magnesium gluconate 500 MG tablet Commonly known as:  MAGONATE Take 500 mg by mouth daily.   metFORMIN 1000 MG tablet Commonly known as:  GLUCOPHAGE Take 1 tablet (1,000 mg total) by mouth 2 (two) times daily with a meal.   MULTIPLE VITAMIN PO Take by mouth. Takes one vitamin daily   Vitamin D3 1000 units Caps Take by mouth. Takes one daily            Discharge Care Instructions        Start     Ordered   04/07/17 0000  POCT HgB A1C     04/07/17 1026      Allergies:  Allergies  Allergen Reactions  . Penicillins Rash    Past Medical History:  Diagnosis Date  . Allergy   . Diabetes mellitus without complication (HCC)   . Hyperlipidemia   . Hypertension   . Thyroid disease     No past surgical history on file.  Family History  Problem Relation Age of Onset  . Diabetes Mother   . Diabetes Father   . Heart disease Father     Social History:  reports that she has never smoked. She has never used smokeless tobacco. Her alcohol and drug histories are not on file.   Review of Systems   Lipid history: Last LDL was 92 From her PCP, she has been treated with Lipitor   No results found for: CHOL, HDL, LDLCALC, LDLDIRECT, TRIG, CHOLHDL         Hypertension: Mild and has been treated with lisinopril 10 mg by PCP  Most recent eye exam was 5/17  Most recent foot exam: 12/2016    LABS: Last TSH in April was 2.2  Office Visit on 04/07/2017  Component Date Value Ref Range Status  . Hemoglobin A1C 04/07/2017 7.5   Final    Physical Examination:  BP 108/60   Pulse 80   Ht  (1.6 m)   Wt 228 lb (103.4 kg)   SpO2 96%   BMI  40.39 kg/m     STANDING blood pressure was 102/64 with large cuff  ASSESSMENT:  Diabetes type 2, uncontrolled with Obesity  See history of present illness for detailed discussion of current diabetes management, blood sugar patterns and problems identified  She Is doing much better with A1c now 7.5 compared to 9.3 previously With adding basal insulin along with a GLP-1 in the form of Soliqua her blood sugars are excellent in the morning now with only mild increase She is also more motivated to watch her diet and has been able to lose weight Does however need to do postprandial readings consistently  HYPERTENSION: Her blood pressure is low normal and probably improved because of her weight loss, improved diet and continuing Invokana 300 mg which is more effective than the [place] she was taking  PLAN:    Continue same dose of Niger  She needs to start alternating fasting and postprandial readings  She can try and see if we can access her glucose data for analysis on the website related to the glucose meter provided by her insurance  She will call if she is having any difficulty with consistent control with fasting or postprandial reading  Continue Invokana also  For now she can stop LISINOPRIL the cause for low normal blood pressure  Increase exercise with walking  She will have labs done with her PCP next month and these will be reviewed when available  Patient Instructions  Check blood sugars on waking up  4/7  Also check blood sugars about 2 hours after a meal and do this after different meals by rotation  Recommended blood sugar levels on waking up is 90-130 and about 2 hours after meal is 130-160  Please bring your blood sugar monitor to each visit, thank you  Stop lisinopril      Drucilla Cumber 04/07/2017, 10:29 AM   Note: This office note was prepared with Dragon voice recognition system technology. Any transcriptional errors that result from this process are unintentional.

## 2017-05-13 ENCOUNTER — Other Ambulatory Visit: Payer: Self-pay | Admitting: Endocrinology

## 2017-05-18 ENCOUNTER — Other Ambulatory Visit: Payer: Self-pay | Admitting: Endocrinology

## 2017-07-01 ENCOUNTER — Telehealth: Payer: Self-pay | Admitting: Endocrinology

## 2017-07-01 ENCOUNTER — Other Ambulatory Visit: Payer: Self-pay

## 2017-07-01 MED ORDER — INSULIN GLARGINE-LIXISENATIDE 100-33 UNT-MCG/ML ~~LOC~~ SOPN: 30.0000 [IU] | PEN_INJECTOR | Freq: Every day | SUBCUTANEOUS | 3 refills | Status: DC

## 2017-07-01 NOTE — Telephone Encounter (Signed)
Patient needs Zadie RhineSolique refilled-she is on her last pen. [facility] [place] ph# [phone]-she has been trying to get this refill for over 1 week Please call patient with status at ph# [phone]

## 2017-07-01 NOTE — Telephone Encounter (Signed)
Called patient and let her know that I received the approval [facility] and also called the pharmacy to make sure they were able to fill the prescription and they were able to from Tammy at the pharmacy.

## 2017-07-01 NOTE — Telephone Encounter (Signed)
Called patient and she stated that her Kara ChambersSoliqua needs a PA.

## 2017-07-01 NOTE — Telephone Encounter (Signed)
Called patients insurance PA line and spoke to [place] with [employer] at [phone] and she stated that the Lawana ChambersSoliqua is approved and they will fax over an approval confirmation number to our office.  [facility] [place] and spoke to Tammy to let her know that this prescription has been approved.

## 2017-07-07 NOTE — Progress Notes (Signed)
Patient ID: Kara Dawson, female   DOB: [birth year], 56 y.o.   MRN: [id number]          Reason for Appointment:  for Type 2 Diabetes  Referring physician: Manus GunningEhinger   History of Present Illness:          Date of diagnosis of type 2 diabetes mellitus: 2004        Background history:   She has been on various oral hyperglycemic agents including metformin, Janumet and Amaryl in the past Apparently her blood sugars were fairly well controlled until 2012 when her A1c was 7.5 and subsequently it has been mostly higher above 8% She has tried other agents including Invokana and Farxiga as well as Bydureon However her A1c has not improved with these changes She apparently had not lost weight with Bydureon and blood sugars were not better  Recent history:       Her baseline A1c was 9.3 in April and is now 6.8  Non-insulin hypoglycemic drugs the patient is taking are: Metformin 1000 twice a day, Amaryl 4 mg twice a day, Invokana 300 mg daily  INJECTABLE drugs: SOLIQUA 27 units daily  Current management, blood sugar patterns and problems identified:  She has had no change in her dose of [place]iqua which she was previously adjusting based on her fasting blood sugars, this has helped her fasting blood sugar and overall control  Although she is having some fluctuation in her fasting readings they're mostly below 140  Only once she felt lightheaded in the morning but her meter was reading 114, lowest blood sugars recorded recently 75  She appears to have relatively low readings during the day but no hypoglycemia, has done more postprandial readings after LUNCH  Although she has lost weight overall there has only been a 2 pound loss in the last 3 months  She is trying to be active but not doing a lot of formal walking  Still using a Generic monitor because of insurance preference       Side effects from medications have been: None  Compliance with the medical regimen: Fair  Glucose  monitoring:  done 0-1  times a day         Glucometer:  Tele[employer]   ?     Blood Glucose readings by home diary:  Mean values apply above for all meters except median for One Touch  PRE-MEAL Fasting Lunch Dinner Bedtime Overall  Glucose range: 92-185      Mean/median:        POST-MEAL PC Breakfast PC Lunch PC Dinner  Glucose range:   95-1 24  95-1 43   Mean/median:        Mean values apply above for all meters except median for One Touch  PRE-MEAL Fasting Lunch Dinner Bedtime Overall  Glucose range: 105-150  113, 130  134   Mean/median:         Self-care: The diet that the patient has been following is: tries to limit Sugar intake .  Recently eating out more, eating out 3-5 times a week at diners and sometimes seafood restaurants     Typical meal intake: Breakfast is protein shake or egg and toast               Dietician visit, most recent: 2013               Exercise:  some walking  Weight history: Previous range 180-281  Wt Readings from Last 3 Encounters:  07/08/17  226 lb 3.2 oz (102.6 kg)  04/07/17 228 lb (103.4 kg)  02/12/17 234 lb 6.4 oz (106.3 kg)    Glycemic control:    Lab Results  Component Value Date   HGBA1C 6.8 07/08/2017   HGBA1C 7.5 04/07/2017   No results found for: GLUF, MICROALBUR, LDLCALC, CREATININE No results found for: MICRALBCREAT  No results found for: FRUCTOSAMINE   Other active problems: See review of systems   Allergies as of 07/08/2017      Reactions   Penicillins Rash      Medication List        Accurate as of 07/08/17  9:33 AM. Always use your most recent med list.          aspirin EC 81 MG tablet Take 81 mg by mouth daily.   atorvastatin 40 MG tablet Commonly known as:  LIPITOR Take 40 mg by mouth daily.   glimepiride 4 MG tablet Commonly known as:  AMARYL Take 1 Tablet by mouth 2 times a day WITH FOOD FOR diabetes   Insulin Glargine-Lixisenatide 100-33 UNT-MCG/ML Sopn Commonly known as:  SOLIQUA Inject 30  Units into the skin daily.   INVOKANA 300 MG Tabs tablet Generic drug:  canagliflozin Take 1 Tablet by mouth once daily BEFORE BREAKFAST   L-Lysine 500 MG Caps Take by mouth. Takes one daily   levothyroxine 50 MCG tablet Commonly known as:  SYNTHROID, LEVOTHROID Take 1 Tablet by mouth every morning ON an EMPTY stomach   magnesium gluconate 500 MG tablet Commonly known as:  MAGONATE Take 500 mg by mouth daily.   metFORMIN 1000 MG tablet Commonly known as:  GLUCOPHAGE Take 1 tablet (1,000 mg total) by mouth 2 (two) times daily with a meal.   MULTIPLE VITAMIN PO Take by mouth. Takes one vitamin daily   Vitamin D3 1000 units Caps Take by mouth. Takes one daily       Allergies:  Allergies  Allergen Reactions  . Penicillins Rash    Past Medical History:  Diagnosis Date  . Allergy   . Diabetes mellitus without complication (HCC)   . Hyperlipidemia   . Hypertension   . Thyroid disease     History reviewed. No pertinent surgical history.  Family History  Problem Relation Age of Onset  . Diabetes Mother   . Diabetes Father   . Heart disease Father     Social History:  reports that  has never smoked. she has never used smokeless tobacco. Her alcohol and drug histories are not on file.   Review of Systems   Lipid history: Last LDL was 91 From her PCP, she has been treated with Lipitor 40 mg   No results found for: CHOL, HDL, LDLCALC, LDLDIRECT, TRIG, CHOLHDL         Hypertension: Mild and has been of lisinopril since 9/18 because of low blood pressures when starting Invokana  Most recent eye exam was 5/17  Most recent foot exam: 12/2016  Most recent calcium 10, Has history of parathyroid surgery  LABS: Last TSH in October by PCP was 1.8  Office Visit on 07/08/2017  Component Date Value Ref Range Status  . Hemoglobin A1C 07/08/2017 6.8   Final    Physical Examination:  BP 124/68 (Cuff Size: Large)   Pulse 95   Ht 5\' 3"  (1.6 m)   Wt 226 lb 3.2 oz  (102.6 kg)   SpO2 (!) 84%   BMI 40.07 kg/m    ASSESSMENT:  Diabetes type 2, uncontrolled  with Obesity  See history of present illness for detailed discussion of current diabetes management, blood sugar patterns and problems identified  She Is doing much better with A1c now 6.8 With adding basal insulin along with a GLP-1 in the form of Soliqua and also using Invokana she has overall good blood sugar readings although occasionally higher in the morning Not clear if her offbrand meter is accurate She does not have high postprandial readings although not checking as much Probably has low normal readings during the day without hypoglycemia  HYPERTENSION: Her blood pressure controlled without any medications  LIPIDS: Well controlled  PLAN:    Continue same dose of Soliqua  She will be alternating fasting and postprandial readings  She can try reducing Amaryl in the morning to a half a tablet  She will try to continue improving her diet and also much more readings after meals  She can increase her exercise like to continue with weight loss efforts  Patient Instructions  Check blood sugars on waking up  4/7  Also check blood sugars about 2 hours after a meal and do this after different meals by rotation  Recommended blood sugar levels on waking up is 90-130 and about 2 hours after meal is 130-160  Please bring your blood sugar monitor to each visit, thank you  amaryl 1/2 in am      Reather LittlerAjay Maliya Marich 07/08/2017, 9:33 AM   Note: This office note was prepared with Dragon voice recognition system technology. Any transcriptional errors that result from this process are unintentional.

## 2017-07-08 ENCOUNTER — Encounter: Payer: Self-pay | Admitting: Endocrinology

## 2017-07-08 ENCOUNTER — Ambulatory Visit: Payer: PRIVATE HEALTH INSURANCE | Admitting: Endocrinology

## 2017-07-08 VITALS — BP 124/68 | HR 95 | Ht 63.0 in | Wt 226.2 lb

## 2017-07-08 DIAGNOSIS — E1165 Type 2 diabetes mellitus with hyperglycemia: Secondary | ICD-10-CM | POA: Diagnosis not present

## 2017-07-08 LAB — POCT GLYCOSYLATED HEMOGLOBIN (HGB A1C): Hemoglobin A1C: 6.8

## 2017-07-08 NOTE — Patient Instructions (Signed)
Check blood sugars on waking up  4/7  Also check blood sugars about 2 hours after a meal and do this after different meals by rotation  Recommended blood sugar levels on waking up is 90-130 and about 2 hours after meal is 130-160  Please bring your blood sugar monitor to each visit, thank you  amaryl 1/2 in am

## 2017-07-22 ENCOUNTER — Telehealth: Payer: Self-pay

## 2017-07-22 NOTE — Telephone Encounter (Signed)
Pa approval received for Soliqua. Approval date began on 07/01/2017 and will end on 07/01/2018. Approval number [id number].

## 2017-08-12 ENCOUNTER — Telehealth: Payer: Self-pay

## 2017-08-12 NOTE — Telephone Encounter (Signed)
Patient has been approved for soliqua patient has been notified

## 2017-09-29 ENCOUNTER — Other Ambulatory Visit: Payer: Self-pay | Admitting: Endocrinology

## 2017-10-06 ENCOUNTER — Other Ambulatory Visit: Payer: Self-pay

## 2017-10-06 ENCOUNTER — Ambulatory Visit: Payer: PRIVATE HEALTH INSURANCE | Admitting: Endocrinology

## 2017-10-06 ENCOUNTER — Encounter: Payer: Self-pay | Admitting: Endocrinology

## 2017-10-06 VITALS — BP 118/76 | HR 86 | Wt 231.0 lb

## 2017-10-06 DIAGNOSIS — E063 Autoimmune thyroiditis: Secondary | ICD-10-CM

## 2017-10-06 DIAGNOSIS — E1165 Type 2 diabetes mellitus with hyperglycemia: Secondary | ICD-10-CM

## 2017-10-06 DIAGNOSIS — E782 Mixed hyperlipidemia: Secondary | ICD-10-CM | POA: Diagnosis not present

## 2017-10-06 DIAGNOSIS — E559 Vitamin D deficiency, unspecified: Secondary | ICD-10-CM | POA: Diagnosis not present

## 2017-10-06 LAB — COMPREHENSIVE METABOLIC PANEL
ALBUMIN: 5 g/dL (ref 3.5–5.2)
ALT: 23 U/L (ref 0–35)
AST: 16 U/L (ref 0–37)
Alkaline Phosphatase: 77 U/L (ref 39–117)
BILIRUBIN TOTAL: 0.3 mg/dL (ref 0.2–1.2)
BUN: 20 mg/dL (ref 6–23)
CALCIUM: 10.2 mg/dL (ref 8.4–10.5)
CHLORIDE: 105 meq/L (ref 96–112)
CO2: 22 meq/L (ref 19–32)
CREATININE: 0.53 mg/dL (ref 0.40–1.20)
GFR: 126.59 mL/min (ref >60.00)
Glucose, Bld: 133 mg/dL — ABNORMAL HIGH (ref 70–99)
Potassium: 4.5 mEq/L (ref 3.5–5.1)
Sodium: 145 mEq/L (ref 135–145)
Total Protein: 7.8 g/dL (ref 6.0–8.3)

## 2017-10-06 LAB — LIPID PANEL
CHOL/HDL RATIO: 4
CHOLESTEROL: 186 mg/dL (ref 0–200)
HDL: 49.5 mg/dL (ref >39.00)
LDL CALC: 103 mg/dL — AB (ref 0–99)
NonHDL: 136.8
TRIGLYCERIDES: 167 mg/dL — AB (ref 0.0–149.0)
VLDL: 33.4 mg/dL (ref 0.0–40.0)

## 2017-10-06 LAB — TSH: TSH: 1.95 u[IU]/mL (ref 0.35–4.50)

## 2017-10-06 LAB — VITAMIN D 25 HYDROXY (VIT D DEFICIENCY, FRACTURES): VITD: 29.53 ng/mL — ABNORMAL LOW (ref 30.00–100.00)

## 2017-10-06 LAB — HEMOGLOBIN A1C: HEMOGLOBIN A1C: 7.5 % — AB (ref 4.6–6.5)

## 2017-10-06 MED ORDER — GLUCOSE BLOOD VI STRP: ORAL_STRIP | 4 refills | Status: DC

## 2017-10-06 NOTE — Progress Notes (Signed)
Patient ID: Kara Dawson, female   DOB: [birth year], 57 y.o.   MRN: [id number]          Reason for Appointment: Follow-up for Type 2 Diabetes  Referring physician: Manus Gunning   History of Present Illness:          Date of diagnosis of type 2 diabetes mellitus: 2004        Background history:   She has been on various oral hyperglycemic agents including metformin, Janumet and Amaryl in the past Apparently her blood sugars were fairly well controlled until 2012 when her A1c was 7.5 and subsequently it has been mostly higher above 8% She has tried other agents including Invokana and Farxiga as well as Bydureon However her A1c has not improved with these changes She apparently had not lost weight with Bydureon and blood sugars were not better  Recent history:       Her baseline A1c was 9.3 in April and on her last visit was 6.8 and pending from today  Non-insulin hypoglycemic drugs the patient is taking are: Metformin 1000 twice a day, Amaryl 2-4 mg twice a day, Invokana 300 mg daily  INJECTABLE drugs: SOLIQUA 27 units daily  Current management, blood sugar patterns and problems identified:  She is using a generic monitor and difficult to analyze the patterns and averages  However her blood sugars in the mornings have been fairly consistent recently and only mildly increased on average  Her blood sugar is the rest of the day are generally lower than in the morning.  After meals  However checking blood sugars after evening meal only sporadically and mostly in the morning  However has gained 5 pounds since her last visit despite continuing Soliqua  Not able to do much exercise lately and probably not watching portions as well recently  Her glimepiride was reduced to half a tablet in the morning on the last visit       Side effects from medications have been: None  Compliance with the medical regimen: Fair  Glucose monitoring:  done at least 1  times a day         Glucometer:   Telecare   ?     Blood Glucose readings by home diary:  Mean values apply above for all meters except median for One Touch  PRE-MEAL Fasting Lunch Dinner Bedtime Overall  Glucose range: 111-153      Mean/median:        POST-MEAL PC Breakfast PC Lunch PC Dinner  Glucose range:   92-146  Mean/median:       Previous readings:  Mean values apply above for all meters except median for One Touch  PRE-MEAL Fasting Lunch Dinner Bedtime Overall  Glucose range: 92-185      Mean/median:        POST-MEAL PC Breakfast PC Lunch PC Dinner  Glucose range:   95-1 24  95-1 43   Mean/median:        Self-care: The diet that the patient has been following is: tries to limit Sugar intake .  Periodically eating out     Typical meal intake: Breakfast is protein shake or egg and toast               Dietician visit, most recent: 2013               Exercise:  less walking  Weight history: Previous range 180-281  Wt Readings from Last 3 Encounters:  10/06/17 231 lb (104.8  kg)  07/08/17 226 lb 3.2 oz (102.6 kg)  04/07/17 228 lb (103.4 kg)    Glycemic control:    Lab Results  Component Value Date   HGBA1C 6.8 07/08/2017   HGBA1C 7.5 04/07/2017   No results found for: GLUF, MICROALBUR, LDLCALC, CREATININE No results found for: MICRALBCREAT  No results found for: FRUCTOSAMINE   Other active problems: See review of systems   Allergies as of 10/06/2017      Reactions   Penicillins Rash      Medication List        Accurate as of 10/06/17 10:08 AM. Always use your most recent med list.          aspirin EC 81 MG tablet Take 81 mg by mouth daily.   atorvastatin 40 MG tablet Commonly known as:  LIPITOR Take 40 mg by mouth daily.   glimepiride 4 MG tablet Commonly known as:  AMARYL Take 1/2 Tablet by mouth in the morning and 1 tablet in the evening WITH FOOD FOR diabetes   Insulin Glargine-Lixisenatide 100-33 UNT-MCG/ML Sopn Commonly known as:  SOLIQUA Inject 30 Units  into the skin daily.   INVOKANA 300 MG Tabs tablet Generic drug:  canagliflozin TAKE 1 TABLET BY MOUTH ONCE DAILY BEFORE BREAKFAST   L-Lysine 500 MG Caps Take by mouth. Takes one daily   levothyroxine 50 MCG tablet Commonly known as:  SYNTHROID, LEVOTHROID Take 1 Tablet by mouth every morning ON an EMPTY stomach   magnesium gluconate 500 MG tablet Commonly known as:  MAGONATE Take 500 mg by mouth daily.   metFORMIN 1000 MG tablet Commonly known as:  GLUCOPHAGE Take 1 tablet (1,000 mg total) by mouth 2 (two) times daily with a meal.   MULTIPLE VITAMIN PO Take by mouth. Takes one vitamin daily   Vitamin D3 1000 units Caps Take by mouth. Takes one daily       Allergies:  Allergies  Allergen Reactions  . Penicillins Rash    Past Medical History:  Diagnosis Date  . Allergy   . Diabetes mellitus without complication (HCC)   . Hyperlipidemia   . Hypertension   . Thyroid disease     No past surgical history on file.  Family History  Problem Relation Age of Onset  . Diabetes Mother   . Diabetes Father   . Heart disease Father     Social History:  reports that  has never smoked. she has never used smokeless tobacco. Her alcohol and drug histories are not on file.   Review of Systems  Other active problems addressed today:  Lipid history: Last LDL was 91 From her PCP, she has been treated with Lipitor 40 mg   No results found for: CHOL, HDL, LDLCALC, LDLDIRECT, TRIG, CHOLHDL         She monitored her blood pressure at home and this is excellent with systolic readings 120 or below without any medications now  Most recent eye exam was 12/2016  Most recent foot exam: 12/2016  History of HYPERPARATHYROIDISM: Most recent calcium 10, Has history of parathyroid surgery  ? Hypothyroid: She has been on 50 mcg of levothyroxine for several years, probably 15 and she does not know why this was started after her parathyroid surgery  LABS: Last TSH in October by PCP  was 1.8   No visits with results within 1 Week(s) from this visit.  Latest known visit with results is:  Office Visit on 07/08/2017  Component Date Value Ref Range Status  .  Hemoglobin A1C 07/08/2017 6.8   Final    Physical Examination:  BP 118/76 (BP Location: Left Arm, Patient Position: Sitting, Cuff Size: Large)   Pulse 86   Wt 231 lb (104.8 kg)   BMI 40.92 kg/m    ASSESSMENT:  Diabetes type 2, with Obesity  See history of present illness for detailed discussion of current diabetes management, blood sugar patterns and problems identified  Her blood sugars appear to be consistently controlled but she is checking blood sugar mostly in the morning The A1c needs to be done today She is on a stable dose of Soliqua along with Invokana and metformin She is however gaining weight and this will be related to inconsistent diet and exercise especially in winter months Blood sugars appear to be relatively lower midday and afternoon although not taking enough readings later in the day  ?  Hypothyroidism: Not clear why she is on thyroid supplements long-term and may have been possibly related to her thyroid nodule when she had parathyroid surgery and will consider tapering off her levothyroxine  History of hyperparathyroidism: Will need follow-up Also need follow-up for vitamin D level, previously has had deficiency  LIPIDS: Well controlled as of 10/18 but will need follow-up labs today    PLAN:    Continue same dose of Soliqua  She was given a One Touch Verio monitor and this appears to be covered by her insurance  Discussed that she can check feel readings in the mornings and more after meals  Discussed blood sugar targets at various times  She does need to continue her Invokana and metformin but if fasting readings go up she can increase dose Soliqua every 2 to 3 days  Stop Amaryl in the morning  Continues to start walking or other exercise  Discussed need to cut back  on higher fat foods especially when eating out to get some weight loss  Check labs for other problems as discussed above  Patient Instructions  Check blood sugars on waking up  4/7 days  Also check blood sugars about 2 hours after a meal and do this after different meals by rotation  Recommended blood sugar levels on waking up is 90-130 and about 2 hours after meal is 130-160  Please bring your blood sugar monitor to each visit, thank you  Stop am Amaryl   Total visit time for evaluation and management of multiple problems and counseling =25 minutes  Reather LittlerAjay Mita Vallo 10/06/2017, 10:08 AM   Note: This office note was prepared with Dragon voice recognition system technology. Any transcriptional errors that result from this process are unintentional.

## 2017-10-06 NOTE — Patient Instructions (Addendum)
Check blood sugars on waking up  4/7 days  Also check blood sugars about 2 hours after a meal and do this after different meals by rotation  Recommended blood sugar levels on waking up is 90-130 and about 2 hours after meal is 130-160  Please bring your blood sugar monitor to each visit, thank you  Stop am Amaryl

## 2017-11-09 ENCOUNTER — Other Ambulatory Visit: Payer: Self-pay | Admitting: Endocrinology

## 2017-11-27 ENCOUNTER — Other Ambulatory Visit: Payer: Self-pay | Admitting: Endocrinology

## 2018-01-16 ENCOUNTER — Other Ambulatory Visit: Payer: Self-pay | Admitting: Endocrinology

## 2018-01-27 ENCOUNTER — Other Ambulatory Visit: Payer: Self-pay | Admitting: Endocrinology

## 2018-02-09 ENCOUNTER — Encounter: Payer: Self-pay | Admitting: Endocrinology

## 2018-02-09 ENCOUNTER — Ambulatory Visit: Payer: PRIVATE HEALTH INSURANCE | Admitting: Endocrinology

## 2018-02-09 VITALS — BP 130/72 | HR 81 | Ht 63.0 in | Wt 233.6 lb

## 2018-02-09 DIAGNOSIS — E1165 Type 2 diabetes mellitus with hyperglycemia: Secondary | ICD-10-CM

## 2018-02-09 DIAGNOSIS — E039 Hypothyroidism, unspecified: Secondary | ICD-10-CM

## 2018-02-09 DIAGNOSIS — Z794 Long term (current) use of insulin: Secondary | ICD-10-CM

## 2018-02-09 LAB — POCT GLYCOSYLATED HEMOGLOBIN (HGB A1C): Hemoglobin A1C: 7.3 % — AB (ref 4.0–5.6)

## 2018-02-09 LAB — COMPREHENSIVE METABOLIC PANEL
ALK PHOS: 72 U/L (ref 39–117)
ALT: 23 U/L (ref 0–35)
AST: 13 U/L (ref 0–37)
Albumin: 4.4 g/dL (ref 3.5–5.2)
BILIRUBIN TOTAL: 0.6 mg/dL (ref 0.2–1.2)
BUN: 21 mg/dL (ref 6–23)
CO2: 30 mEq/L (ref 19–32)
Calcium: 9.7 mg/dL (ref 8.4–10.5)
Chloride: 102 mEq/L (ref 96–112)
Creatinine, Ser: 0.53 mg/dL (ref 0.40–1.20)
GFR: 126.44 mL/min (ref >60.00)
GLUCOSE: 116 mg/dL — AB (ref 70–99)
Potassium: 4.3 mEq/L (ref 3.5–5.1)
SODIUM: 141 meq/L (ref 135–145)
TOTAL PROTEIN: 7.6 g/dL (ref 6.0–8.3)

## 2018-02-09 LAB — MICROALBUMIN / CREATININE URINE RATIO
Creatinine,U: 66.5 mg/dL
Microalb Creat Ratio: 1.1 mg/g (ref 0.0–30.0)
Microalb, Ur: 0.7 mg/dL (ref 0.0–1.9)

## 2018-02-09 LAB — TSH: TSH: 1.44 u[IU]/mL (ref 0.35–4.50)

## 2018-02-09 NOTE — Patient Instructions (Addendum)
Soliqua 29-30 units  Stop am Glimeperide in am  Vitamin D3, 2000 units   Check blood sugars on waking up  4/7  Also check blood sugars about 2 hours after a meal and do this after different meals by rotation  Recommended blood sugar levels on waking up is 90-130 and about 2 hours after meal is 130-160  Please bring your blood sugar monitor to each visit, thank you

## 2018-02-09 NOTE — Progress Notes (Signed)
Patient ID: Kara Dawson, female   DOB: [birth year], 57 y.o.   MRN: [id number]          Reason for Appointment: Endocrinology follow-up  Referring physician: Ehinger   History of Present Illness:          Date of diagnosis of type 2 diabetes mellitus: 2004        Background history:   She has been on various oral hyperglycemic agents including metformin, Janumet and Amaryl in the past Apparently her blood sugars were fairly well controlled until 2012 when her A1c was 7.5 and subsequently it has been mostly higher above 8% She has tried other agents including Invokana and Farxiga as well as Bydureon However her A1c has not improved with these changes She apparently had not lost weight with Bydureon and blood sugars were not better  Recent history:       Her baseline A1c was 9.3 in April 2018 and is now 7.3 compared to 7.5 earlier this year  Non-insulin hypoglycemic drugs the patient is taking are: Metformin 1000 twice a day, Amaryl 2 mg morning, 4 mg p.m., Invokana 300 mg daily  INJECTABLE drugs: SOLIQUA 27 units daily  Current management, blood sugar patterns and problems identified:  She is now able to use the One Touch meter and blood sugars were reviewed from download  Fasting sugars about the same as before but averaging nearly 140  Blood sugars are relatively lower at lunchtime and recently good at bedtime time  She has however started getting back more weight  She is trying to be active but not doing much formal exercise  Also she thinks she is trying to be watching what she is eating with portions and carbohydrates  Not too many readings after meals being checked       Side effects from medications have been: None  Compliance with the medical regimen: Fair  Glucose monitoring:  done at least 1  times a day         Glucometer:  Telecare   ?     Blood Glucose readings by home diary:  Mean values apply above for all meters except median for One  Touch  PRE-MEAL Fasting Lunch Dinner Bedtime Overall  Glucose range: 113-175      Mean/median:  138  103  140   135+/-23   POST-MEAL PC Breakfast PC Lunch PC Dinner  Glucose range:    116-149  Mean/median:    136    Previous readings:  Mean values apply above for all meters except median for One Touch  PRE-MEAL Fasting Lunch Dinner Bedtime Overall  Glucose range: 92-185      Mean/median:        POST-MEAL PC Breakfast PC Lunch PC Dinner  Glucose range:   95-1 24  95-1 43   Mean/median:        Self-care: The diet that the patient has been following is: tries to limit Sugar intake .  Periodically eating out     Typical meal intake: Breakfast is protein shake or egg and toast               Dietician visit, most recent: 2013               Exercise:  some walking gardening  Weight history: Previous range 180-281  Wt Readings from Last 3 Encounters:  02/09/18 233 lb 9.6 oz (106 kg)  10/06/17 231 lb (104.8 kg)  07/08/17 226 lb 3.2 oz (102.6  kg)    Glycemic control:    Lab Results  Component Value Date   HGBA1C 7.3 (A) 02/09/2018   HGBA1C 7.5 (H) 10/06/2017   HGBA1C 6.8 07/08/2017   Lab Results  Component Value Date   LDLCALC 103 (H) 10/06/2017   CREATININE 0.53 10/06/2017   No results found for: MICRALBCREAT  No results found for: FRUCTOSAMINE   Other active problems: See review of systems   Allergies as of 02/09/2018      Reactions   Penicillins Rash      Medication List        Accurate as of 02/09/18  9:59 AM. Always use your most recent med list.          aspirin EC 81 MG tablet Take 81 mg by mouth daily.   atorvastatin 40 MG tablet Commonly known as:  LIPITOR Take 40 mg by mouth daily.   glimepiride 4 MG tablet Commonly known as:  AMARYL TAKE 1 TABLET BY MOUTH 2 TIMES A DAY WITH FOOD FOR DIABETES   glucose blood test strip Commonly known as:  ONETOUCH VERIO Use to test blood sugar twice daily Dx code E11.65   INVOKANA 300 MG Tabs  tablet Generic drug:  canagliflozin TAKE 1 TABLET BY MOUTH ONCE DAILY BEFORE BREAKFAST   L-Lysine 500 MG Caps Take by mouth. Takes one daily   levothyroxine 50 MCG tablet Commonly known as:  SYNTHROID, LEVOTHROID Take 1 Tablet by mouth every morning ON an EMPTY stomach   magnesium gluconate 500 MG tablet Commonly known as:  MAGONATE Take 500 mg by mouth daily.   metFORMIN 1000 MG tablet Commonly known as:  GLUCOPHAGE TAKE 1 TABLET BY MOUTH 2 TIMES A DAY WITH A MEAL   MULTIPLE VITAMIN PO Take by mouth. Takes one vitamin daily   SOLIQUA 100-33 UNT-MCG/ML Sopn Generic drug:  Insulin Glargine-Lixisenatide INJECT 30 UNITS INTO THE SKIN DAILY.   Vitamin D3 1000 units Caps Take by mouth. Takes one daily       Allergies:  Allergies  Allergen Reactions  . Penicillins Rash    Past Medical History:  Diagnosis Date  . Allergy   . Diabetes mellitus without complication (HCC)   . Hyperlipidemia   . Hypertension   . Thyroid disease     History reviewed. No pertinent surgical history.  Family History  Problem Relation Age of Onset  . Diabetes Mother   . Diabetes Father   . Heart disease Father     Social History:  reports that she has never smoked. She has never used smokeless tobacco. Her alcohol and drug histories are not on file.   Review of Systems  Other active problems addressed today:  Lipid history: Last LDL was above 100, getting Lipitor from her PCP, due for follow-up next October She does not think she is eating a lot of fatty foods     Lab Results  Component Value Date   CHOL 186 10/06/2017   HDL 49.50 10/06/2017   LDLCALC 103 (H) 10/06/2017   TRIG 167.0 (H) 10/06/2017   CHOLHDL 4 10/06/2017            Most recent eye exam was 12/2016  Most recent foot exam: 12/2016  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 However recent calcium available and previously to 10.2 she does not think she has history of  osteoporosis  Lab Results  Component Value Date   CALCIUM 10.2 10/06/2017     Mild hypothyroidism: She has been  on 50 mcg of levothyroxine for several years  she does not know why this was started after her parathyroid surgery  LABS:   Lab Results  Component Value Date   TSH 1.95 10/06/2017     Physical Examination:  BP 130/72 (BP Location: Left Arm, Patient Position: Sitting, Cuff Size: Normal)   Pulse 81   Ht 5\' 3"  (1.6 m)   Wt 233 lb 9.6 oz (106 kg)   SpO2 97%   BMI 41.38 kg/m   Diabetic Foot Exam - Simple   Simple Foot Form Diabetic Foot exam was performed with the following findings:  Yes   Visual Inspection No deformities, no ulcerations, no other skin breakdown bilaterally:  Yes Sensation Testing Intact to touch and monofilament testing bilaterally:  Yes Pulse Check See comments:  Yes Comments Pulses on left foot decreased    No ankle edema present  ASSESSMENT:  Diabetes type 2, with Obesity  See history of present illness for detailed discussion of current diabetes management, blood sugar patterns and problems identified  Her A1c is reasonably good at 7.3  This is with multiple medications including Soliqua, Invokana, Amaryl and metformin  Her blood sugars appear to be relatively high fasting occasionally after lunch However with Amaryl she may be low normal at lunchtime sometimes She has difficulty with weight gain again and not clear if she can do much better with lifestyle changes  History of hyperparathyroidism: Will need follow-up PTH level since her last calcium was high normal Need records from PCP regarding bone density  Vitamin D deficiency: Her last level was just below 30 and she needs to increase her dose to 2000 units vitamin D3  LIPIDS: Being followed by PCP, will have labs in October for annual exam Discussed that her LDL was above 100 and may need to discuss with his PCP again    PLAN:    Increase dose of Soliqua by at  least 2 units up to 29, discussed blood sugar targets both fasting and postprandial  She needs to reduce any excess carbohydrate intake  More regular walking for exercise  Try to stop her morning Amaryl with increasing her Lawana ChambersSoliqua  However needs to check more readings after both lunch and dinner  Check renal function since she is on Invokana  Also see above for other problems  Patient Instructions  Soliqua 29-30 units  Stop am Glimeperide in am  Vitamin D3, 2000 units   Check blood sugars on waking up  4/7  Also check blood sugars about 2 hours after a meal and do this after different meals by rotation  Recommended blood sugar levels on waking up is 90-130 and about 2 hours after meal is 130-160  Please bring your blood sugar monitor to each visit, thank you     Total visit time for evaluation and management of multiple problems and counseling =25 minutes  Kara Dawson 02/09/2018, 9:59 AM   Note: This office note was prepared with Dragon voice recognition system technology. Any transcriptional errors that result from this process are unintentional.

## 2018-02-10 LAB — PARATHYROID HORMONE, INTACT (NO CA): PTH: 12 pg/mL — ABNORMAL LOW (ref 15–65)

## 2018-04-23 ENCOUNTER — Other Ambulatory Visit: Payer: Self-pay | Admitting: Endocrinology

## 2018-04-27 ENCOUNTER — Other Ambulatory Visit: Payer: Self-pay | Admitting: Endocrinology

## 2018-05-01 ENCOUNTER — Other Ambulatory Visit: Payer: Self-pay | Admitting: Endocrinology

## 2018-06-05 ENCOUNTER — Other Ambulatory Visit: Payer: Self-pay | Admitting: Endocrinology

## 2018-06-15 ENCOUNTER — Ambulatory Visit: Payer: PRIVATE HEALTH INSURANCE | Admitting: Endocrinology

## 2018-06-15 ENCOUNTER — Encounter: Payer: Self-pay | Admitting: Endocrinology

## 2018-06-15 VITALS — BP 110/78 | HR 88 | Ht 63.0 in | Wt 236.4 lb

## 2018-06-15 DIAGNOSIS — E1165 Type 2 diabetes mellitus with hyperglycemia: Secondary | ICD-10-CM | POA: Diagnosis not present

## 2018-06-15 LAB — POCT GLYCOSYLATED HEMOGLOBIN (HGB A1C): HEMOGLOBIN A1C: 7.3 % — AB (ref 4.0–5.6)

## 2018-06-15 LAB — GLUCOSE, POCT (MANUAL RESULT ENTRY)

## 2018-06-15 NOTE — Patient Instructions (Addendum)
32 units and take at supper; keep am sugar < 130   INDOOR EXERCISE IDEAS   Use the following examples for a creative indoor workout (perform each move for 2-3 minutes):   Warm up. Put on some music that makes you feel like moving, and dance around the living room.  Watch exercise shows on TV and move along with them. There are tons of free cable channels that have daily exercise shows on them for all levels - beginner through advanced.   You can easily find a number of exercise videos but use one that will suit your liking and exercise level; you can do these on your own schedule.  Walk up and down the steps.  Do dumbbell curls and presses (if you don't have weights, use full water bottles).  Do assisted squats, keeping your back on a fitness ball against the wall or using the back of the couch for support.  Shadow box: Lift and lower the left leg; jab with the right arm, then the left; then lift and lower the right leg.  Fence (you don't even need swords). Pretend you're holding a sword in each hand. Create an X pattern standing still, then moving forward and back.  Hop on your exercise bike or treadmill -- or, for something different, use a weighted hula hoop. If you don't have any of those, just go back to dancing.  Do abdominal crunches (hold a weighted ball for added resistance).  Cool down with [employer] Brown's "I Feel Good" -- or whatever tune makes you feel good   Check Vitamin D and thyroid

## 2018-06-15 NOTE — Progress Notes (Signed)
Patient ID: Kara RanaJanet C Hopping, female   DOB: [birth year], 57 y.o.   MRN: [id number]          Reason for Appointment: Endocrinology follow-up  Referring physician: Ehinger   History of Present Illness:          Date of diagnosis of type 2 diabetes mellitus: 2004        Background history:   She has been on various oral hyperglycemic agents including metformin, Janumet and Amaryl in the past Apparently her blood sugars were fairly well controlled until 2012 when her A1c was 7.5 and subsequently it has been mostly higher above 8% She has tried other agents including Invokana and Farxiga as well as Bydureon However her A1c has not improved with these changes She apparently had not lost weight with Bydureon and blood sugars were not better  Recent history:       Her baseline A1c was 9.3 in April 2018 and is again 7.3  Non-insulin hypoglycemic drugs the patient is taking are: Metformin 1000 twice a day, Amaryl 4 mg p.m., Invokana 300 mg daily  INJECTABLE drugs: SOLIQUA 29 units daily  Current management, blood sugar patterns and problems identified:  Her fasting blood sugars are higher than on her last visit  This is apparently because of her stress, not being able to plan her meals well, sometimes eating late at night and no exercise  Weight has gone up  She has however relatively better blood sugars midday as low as 85  She is not checking her sugars later in the day and only once had a reading of 181 after supper  She did not go up on the dose of Soliqua even though she was advised to do so to get her morning sugars down  However Amaryl was stopped in the morning and the last visit still taking 4 mg at dinnertime       Side effects from medications have been: None  Compliance with the medical regimen: Fair  Glucose monitoring:  done at least 1  times a day         Glucometer:  One Touch       Blood Glucose readings by review of download  PRE-MEAL Fasting Lunch Dinner  Bedtime Overall  Glucose range:  110-200  85-152  132    Mean/median:  161  112   155   POST-MEAL PC Breakfast PC Lunch PC Dinner  Glucose range:  112-232   181  Mean/median:      Previously  Mean values apply above for all meters except median for One Touch  PRE-MEAL Fasting Lunch Dinner Bedtime Overall  Glucose range: 113-175      Mean/median:  138  103  140   135+/-23   POST-MEAL PC Breakfast PC Lunch PC Dinner  Glucose range:    116-149  Mean/median:    136     Self-care: The diet that the patient has been following is: tries to limit Sugar intake .  Periodically eating out     Typical meal intake: Breakfast is protein shake or egg and toast               Dietician visit, most recent: 2013               Exercise: none  Weight history: Previous range 180-281  Wt Readings from Last 3 Encounters:  06/15/18 236 lb 6.4 oz (107.2 kg)  02/09/18 233 lb 9.6 oz (106 kg)  10/06/17 231 lb (  104.8 kg)    Glycemic control:    Lab Results  Component Value Date   HGBA1C 7.3 (A) 06/15/2018   HGBA1C 7.3 (A) 02/09/2018   HGBA1C 7.5 (H) 10/06/2017   Lab Results  Component Value Date   MICROALBUR 0.7 02/09/2018   LDLCALC 103 (H) 10/06/2017   CREATININE 0.53 02/09/2018   Lab Results  Component Value Date   MICRALBCREAT 1.1 02/09/2018    No results found for: FRUCTOSAMINE   Other active problems: See review of systems   Allergies as of 06/15/2018      Reactions   Penicillins Rash      Medication List        Accurate as of 06/15/18 12:14 PM. Always use your most recent med list.          aspirin EC 81 MG tablet Take 81 mg by mouth daily.   atorvastatin 40 MG tablet Commonly known as:  LIPITOR Take 40 mg by mouth daily.   atorvastatin 80 MG tablet Commonly known as:  LIPITOR TAKE ONE-HALF TABLET BY MOUTH EVERY DAY FOR CHOLESTEROL   glimepiride 4 MG tablet Commonly known as:  AMARYL TAKE 1 TABLET BY MOUTH 2 TIMES A DAY WITH FOOD FOR DIABETES     glucose blood test strip Use to test blood sugar twice daily Dx code E11.65   INVOKANA 300 MG Tabs tablet Generic drug:  canagliflozin TAKE 1 TABLET BY MOUTH ONCE DAILY BEFORE BREAKFAST   L-Lysine 500 MG Caps Take by mouth. Takes one daily   levothyroxine 50 MCG tablet Commonly known as:  SYNTHROID, LEVOTHROID TAKE 1 TABLET BY MOUTH EVERY MORNING ON AN EMPTY STOMACH   magnesium gluconate 500 MG tablet Commonly known as:  MAGONATE Take 500 mg by mouth daily.   metFORMIN 1000 MG tablet Commonly known as:  GLUCOPHAGE TAKE 1 TABLET BY MOUTH 2 TIMES A DAY WITH A MEAL   MULTIPLE VITAMIN PO Take by mouth. Takes one vitamin daily   SOLIQUA 100-33 UNT-MCG/ML Sopn Generic drug:  Insulin Glargine-Lixisenatide INJECT 30 UNITS INTO THE SKIN DAILY.   Vitamin D3 25 MCG (1000 UT) Caps Take by mouth. Takes one daily       Allergies:  Allergies  Allergen Reactions  . Penicillins Rash    Past Medical History:  Diagnosis Date  . Allergy   . Diabetes mellitus without complication (HCC)   . Hyperlipidemia   . Hypertension   . Thyroid disease     History reviewed. No pertinent surgical history.  Family History  Problem Relation Age of Onset  . Diabetes Mother   . Diabetes Father   . Heart disease Father     Social History:  reports that she has never smoked. She has never used smokeless tobacco. Her alcohol and drug histories are not on file.   Review of Systems  Other active problems addressed today:  Lipid history: Last LDL was above 100, getting Lipitor from her PCP, due for follow-up next week    Lab Results  Component Value Date   CHOL 186 10/06/2017   HDL 49.50 10/06/2017   LDLCALC 103 (H) 10/06/2017   TRIG 167.0 (H) 10/06/2017   CHOLHDL 4 10/06/2017            Most recent eye exam was 12/2016  Most recent foot exam: 7/19  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 Calcium normal although as high as 10.2  She does not think  she has history of osteoporosis  Lab Results  Component Value Date   PTH 12 (L) 02/09/2018   CALCIUM 9.7 02/09/2018      Mild hypothyroidism: She has been on 50 mcg of levothyroxine for several years, not clear if she had baseline hypothyroidism  Has been on a stable dose  LABS:   Lab Results  Component Value Date   TSH 1.44 02/09/2018     Physical Examination:  BP 110/78 (BP Location: Left Arm, Patient Position: Sitting, Cuff Size: Normal)   Pulse 88   Ht 5\' 3"  (1.6 m)   Wt 236 lb 6.4 oz (107.2 kg)   SpO2 98%   BMI 41.88 kg/m     ASSESSMENT:  Diabetes type 2, with Obesity  See history of present illness for detailed discussion of current diabetes management, blood sugar patterns and problems identified  Her A1c is still not at target although unchanged at 7.3  Currently on multiple medications including Soliqua, Invokana, Amaryl and metformin  Her blood sugars are higher fasting This may be related to a GLP-1 in her [place] not lasting 24 hours blood sugars are generally better in the middle of the day and not as high before and after dinner although she is only rarely checking them later in the day  She also can do better with diet and exercise and has gained weight  History of hyperparathyroidism: She will have follow-up calcium from PCP next week Also will periodically need bone densities  Vitamin D deficiency: Her last level was just below 30 and currently recommended 2000 units vitamin D3  LIPIDS: Being followed by PCP, will have labs next week LDL needs to be below 100   PLAN:    Increase dose of Soliqua up to 32 units and start taking it in the evening as a trial  If her fasting readings are better she will continue the evening dose  Also need to check readings after lunch and dinner more consistently  Given ideas for indoor exercise  She will try to improve her diet consistently for weight loss  To have labs including lipids, vitamin D  and thyroid levels done with her PCP at her annual exam next week  Follow-up in 3 months  Patient Instructions  32 units and take at supper; keep am sugar < 130   INDOOR EXERCISE IDEAS   Use the following examples for a creative indoor workout (perform each move for 2-3 minutes):   Warm up. Put on some music that makes you feel like moving, and dance around the living room.  Watch exercise shows on TV and move along with them. There are tons of free cable channels that have daily exercise shows on them for all levels - beginner through advanced.   You can easily find a number of exercise videos but use one that will suit your liking and exercise level; you can do these on your own schedule.  Walk up and down the steps.  Do dumbbell curls and presses (if you don't have weights, use full water bottles).  Do assisted squats, keeping your back on a fitness ball against the wall or using the back of the couch for support.  Shadow box: Lift and lower the left leg; jab with the right arm, then the left; then lift and lower the right leg.  Fence (you don't even need swords). Pretend you're holding a sword in each hand. Create an X pattern standing still, then moving forward and back.  Hop on your exercise bike or treadmill -- or, for  something different, use a weighted hula hoop. If you don't have any of those, just go back to dancing.  Do abdominal crunches (hold a weighted ball for added resistance).  Cool down with [employer] "I Feel Good" -- or whatever tune makes you feel good   Check Vitamin D and thyroid     Reather Littler 06/15/2018, 12:14 PM   Note: This office note was prepared with Dragon voice recognition system technology. Any transcriptional errors that result from this process are unintentional.

## 2018-08-01 ENCOUNTER — Telehealth: Payer: Self-pay

## 2018-08-01 NOTE — Telephone Encounter (Signed)
PA initiated for Soliqua 100-33 unit-mcg/mL pen injectors UOR:[id number]

## 2018-09-15 ENCOUNTER — Ambulatory Visit: Payer: PRIVATE HEALTH INSURANCE | Admitting: Endocrinology

## 2018-09-26 ENCOUNTER — Other Ambulatory Visit: Payer: Self-pay | Admitting: Endocrinology

## 2018-09-27 ENCOUNTER — Other Ambulatory Visit: Payer: Self-pay

## 2018-09-27 ENCOUNTER — Ambulatory Visit: Payer: PRIVATE HEALTH INSURANCE | Admitting: Endocrinology

## 2018-09-27 ENCOUNTER — Encounter: Payer: Self-pay | Admitting: Endocrinology

## 2018-09-27 VITALS — BP 120/64 | HR 86 | Ht 63.0 in | Wt 240.0 lb

## 2018-09-27 DIAGNOSIS — E039 Hypothyroidism, unspecified: Secondary | ICD-10-CM | POA: Diagnosis not present

## 2018-09-27 DIAGNOSIS — E1165 Type 2 diabetes mellitus with hyperglycemia: Secondary | ICD-10-CM

## 2018-09-27 DIAGNOSIS — E559 Vitamin D deficiency, unspecified: Secondary | ICD-10-CM

## 2018-09-27 DIAGNOSIS — E782 Mixed hyperlipidemia: Secondary | ICD-10-CM | POA: Diagnosis not present

## 2018-09-27 LAB — COMPREHENSIVE METABOLIC PANEL
ALK PHOS: 76 U/L (ref 39–117)
ALT: 30 U/L (ref 0–35)
AST: 24 U/L (ref 0–37)
Albumin: 4.6 g/dL (ref 3.5–5.2)
BILIRUBIN TOTAL: 0.7 mg/dL (ref 0.2–1.2)
BUN: 19 mg/dL (ref 6–23)
CO2: 27 mEq/L (ref 19–32)
Calcium: 9.9 mg/dL (ref 8.4–10.5)
Chloride: 100 mEq/L (ref 96–112)
Creatinine, Ser: 0.49 mg/dL (ref 0.40–1.20)
GFR: 129.94 mL/min (ref >60.00)
Glucose, Bld: 92 mg/dL (ref 70–99)
Potassium: 4 mEq/L (ref 3.5–5.1)
Sodium: 139 mEq/L (ref 135–145)
Total Protein: 7.7 g/dL (ref 6.0–8.3)

## 2018-09-27 LAB — POCT GLYCOSYLATED HEMOGLOBIN (HGB A1C): Hemoglobin A1C: 7.8 % — AB (ref 4.0–5.6)

## 2018-09-27 LAB — TSH: TSH: 1.56 u[IU]/mL (ref 0.35–4.50)

## 2018-09-27 LAB — VITAMIN D 25 HYDROXY (VIT D DEFICIENCY, FRACTURES): VITD: 48.66 ng/mL (ref 30.00–100.00)

## 2018-09-27 MED ORDER — EZETIMIBE 10 MG PO TABS: 10.0000 mg | ORAL_TABLET | Freq: Every day | ORAL | 3 refills | Status: DC

## 2018-09-27 NOTE — Patient Instructions (Addendum)
Soliqua 10 units in am and 22 before supper  Check blood sugars on waking up 4-5 days a week  Also check blood sugars about 2 hours after meals and do this after different meals by rotation  Recommended blood sugar levels on waking up are 90-130 and about 2 hours after meal is 130-180  Please bring your blood sugar monitor to each visit, thank you

## 2018-09-27 NOTE — Progress Notes (Signed)
Patient ID: Kara Dawson, female   DOB: 07/24/1960, 58 y.o.   MRN: [id number]          Reason for Appointment: Endocrinology follow-up  Referring physician: Ehinger   History of Present Illness:          Date of diagnosis of type 2 diabetes mellitus: 2004        Background history:   She has been on various oral hyperglycemic agents including metformin, Janumet and Amaryl in the past Apparently her blood sugars were fairly well controlled until 2012 when her A1c was 7.5 and subsequently it has been mostly higher above 8% She has tried other agents including Invokana and Farxiga as well as Bydureon However her A1c has not improved with these changes She apparently had not lost weight with Bydureon and blood sugars were not better  Recent history:       Her baseline A1c was 9.3 in April 2018 and is now 7.8 and slightly higher  Non-insulin hypoglycemic drugs the patient is taking are: Metformin 1000 twice a day, Amaryl 4 mg p.m., Invokana 300 mg daily  INJECTABLE drugs: SOLIQUA 32 units daily  Current management, blood sugar patterns and problems identified:  Her fasting blood sugars are still significantly higher overall  As before she thinks that she is not watching her diet well and having a snack at bedtime along with not controlling portions in the evening  Today however with not eating a late evening snack her early morning blood sugar was only 98  She thinks she tried taking Soliqua in the evening but it did not help her morning sugars  Blood sugars are being checked only occasionally at lunch and once in the afternoon and these are generally lower than in the morning  No hypoglycemia  She is not exercising and only occasionally active with yard work  Has gained 4 pounds since her last visit       Side effects from medications have been: None  Compliance with the medical regimen: Fair  Glucose monitoring:  done at least 1  times a day         Glucometer:  One  Touch       Blood Glucose readings by review of download   PRE-MEAL Fasting Lunch Dinner Bedtime Overall  Glucose range:  98-227  131, 134  109    Mean/median: 171     152   POST-MEAL PC Breakfast PC Lunch PC Dinner  Glucose range:  100-209  ?  Mean/median:       PREVIOUS readings  PRE-MEAL Fasting Lunch Dinner Bedtime Overall  Glucose range:  110-200  85-152  132    Mean/median:  161  112   155   POST-MEAL PC Breakfast PC Lunch PC Dinner  Glucose range:  112-232   181  Mean/median:        Self-care: The diet that the patient has been following is: tries to limit Sugar intake .  Periodically eating out     Typical meal intake: Breakfast is protein shake or egg and toast               Dietician visit, most recent: 2013               Exercise:  Inconsistent  Weight history: Previous range 180-281  Wt Readings from Last 3 Encounters:  09/27/18 240 lb (108.9 kg)  06/15/18 236 lb 6.4 oz (107.2 kg)  02/09/18 233 lb 9.6 oz (106 kg)  Glycemic control:    Lab Results  Component Value Date   HGBA1C 7.8 (A) 09/27/2018   HGBA1C 7.3 (A) 06/15/2018   HGBA1C 7.3 (A) 02/09/2018   Lab Results  Component Value Date   MICROALBUR 0.7 02/09/2018   LDLCALC 103 (H) 10/06/2017   CREATININE 0.53 02/09/2018   Lab Results  Component Value Date   MICRALBCREAT 1.1 02/09/2018    No results found for: FRUCTOSAMINE   Other active problems: See review of systems   Allergies as of 09/27/2018      Reactions   Penicillins Rash      Medication List       Accurate as of September 27, 2018  3:47 PM. Always use your most recent med list.        aspirin EC 81 MG tablet Take 81 mg by mouth daily.   atorvastatin 80 MG tablet Commonly known as:  LIPITOR TAKE ONE-HALF TABLET BY MOUTH EVERY DAY FOR CHOLESTEROL   glimepiride 4 MG tablet Commonly known as:  AMARYL TAKE 1 TABLET BY MOUTH 2 TIMES A DAY WITH FOOD FOR DIABETES   glucose blood test strip Commonly known as:   OneTouch Verio Use to test blood sugar twice daily Dx code E11.65   Invokana 300 MG Tabs tablet Generic drug:  canagliflozin TAKE 1 TABLET BY MOUTH ONCE DAILY BEFORE BREAKFAST   L-Lysine 500 MG Caps Take by mouth. Takes one daily   levothyroxine 50 MCG tablet Commonly known as:  SYNTHROID, LEVOTHROID TAKE 1 TABLET BY MOUTH EVERY MORNING ON AN EMPTY STOMACH   magnesium gluconate 500 MG tablet Commonly known as:  MAGONATE Take 500 mg by mouth daily.   metFORMIN 1000 MG tablet Commonly known as:  GLUCOPHAGE TAKE 1 TABLET BY MOUTH 2 TIMES A DAY WITH A MEAL   MULTIPLE VITAMIN PO Take by mouth. Takes one vitamin daily   Soliqua 100-33 UNT-MCG/ML Sopn Generic drug:  Insulin Glargine-Lixisenatide INJECT 30 UNITS INTO THE SKIN DAILY.   Vitamin D3 25 MCG (1000 UT) Caps Take by mouth. Takes one daily in the evening.       Allergies:  Allergies  Allergen Reactions  . Penicillins Rash    Past Medical History:  Diagnosis Date  . Allergy   . Diabetes mellitus without complication (HCC)   . Hyperlipidemia   . Hypertension   . Thyroid disease     History reviewed. No pertinent surgical history.  Family History  Problem Relation Age of Onset  . Diabetes Mother   . Diabetes Father   . Heart disease Father     Social History:  reports that she has never smoked. She has never used smokeless tobacco. No history on file for alcohol and drug.   Review of Systems   Lipid history: Last LDL was above 100, getting Lipitor 40 from her PCP She thinks that 80 mg Lipitor causes muscle aches Also she had muscle pain or cramps with Crestor several years ago Last LDL was at 130 done by PCP   Lab Results  Component Value Date   CHOL 186 10/06/2017   HDL 49.50 10/06/2017   LDLCALC 103 (H) 10/06/2017   TRIG 167.0 (H) 10/06/2017   CHOLHDL 4 10/06/2017            Most recent eye exam was 12/2017  Most recent foot exam: 7/19  History of 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  parathyroid surgery Calcium normal although as high as 10.2  She does not think she has history of  osteoporosis  On 4000 U D3 for supplementation, recently no levels available  Lab Results  Component Value Date   PTH 12 (L) 02/09/2018   CALCIUM 9.7 02/09/2018     Mild hypothyroidism: She has been on 50 mcg of levothyroxine for several years, not clear if she had baseline hypothyroidism  Has been on a stable dose  LABS:   Lab Results  Component Value Date   TSH 1.44 02/09/2018     Physical Examination:  BP 120/64 (BP Location: Left Arm, Patient Position: Sitting, Cuff Size: Normal)   Pulse 86   Ht 5\' 3"  (1.6 m)   Wt 240 lb (108.9 kg)   SpO2 97%   BMI 42.51 kg/m     ASSESSMENT:  Diabetes type 2, with Obesity  See history of present illness for detailed discussion of current diabetes management, blood sugar patterns and problems identified  Her A1c is going up and now 7.8  Currently on multiple medications including Soliqua, Invokana, Amaryl 4 mg once a day and metformin  Her blood sugars are higher fasting although she does not check readings after supper Even with trying the Soliqua in the evening she does not think her sugars in the mornings were better However she thinks she can do better with diet especially with eating more healthy foods in the evening and avoiding snacks late at night Also gaining weight without adequate exercise  This may be related to a GLP-1 in her [place] not lasting 24 hours blood sugars are generally better in the middle of the day and not as high before and after dinner although she is only rarely checking them later in the day  She also can do better with diet and exercise and has gained weight  History of hyperparathyroidism: She will have follow-up calcium today Also will periodically need bone density assessed  Vitamin D deficiency: Level needs to be rechecked, currently on the recommended 2000 units vitamin D3  LIPIDS:  Being followed by PCP, however LDL is 130 and not responding to Lipitor alone   PLAN:    We will give her a trial of twice a day Soliqua with 10 units in the morning and 22 at suppertime  She will cut back on snacks at night  More regular exercise  More readings after supper at night  To call if blood sugars are not consistently controlled  No change in other medications for diabetes  Trial of Zetia in addition to Lipitor for lipids  Follow-up lipids on the next visit  To check chemistry and vitamin D as well as thyroid levels today  Review progress in 2 months  Patient Instructions  Soliqua 10 units in am and 22 before supper  Check blood sugars on waking up 4-5 days a week  Also check blood sugars about 2 hours after meals and do this after different meals by rotation  Recommended blood sugar levels on waking up are 90-130 and about 2 hours after meal is 130-180  Please bring your blood sugar monitor to each visit, thank you   Total visit time for evaluation and management of multiple problems and counseling =25 minutes    Reather LittlerAjay Lamoine Fredricksen 09/27/2018, 3:47 PM   Note: This office note was prepared with Dragon voice recognition system technology. Any transcriptional errors that result from this process are unintentional.

## 2018-10-14 ENCOUNTER — Telehealth: Payer: Self-pay | Admitting: Endocrinology

## 2018-10-14 ENCOUNTER — Other Ambulatory Visit: Payer: Self-pay

## 2018-10-14 MED ORDER — INSULIN GLARGINE-LIXISENATIDE 100-33 UNT-MCG/ML ~~LOC~~ SOPN: 32.0000 [IU] | PEN_INJECTOR | Freq: Every day | SUBCUTANEOUS | 3 refills | Status: DC

## 2018-10-14 NOTE — Telephone Encounter (Signed)
Rx sent 

## 2018-10-14 NOTE — Telephone Encounter (Signed)
Patient's Husband is following up with our office to see if the patient's medication was sent to the [facility] SOLIQUA 100-33 UNT-MCG/ML SOPN

## 2018-10-21 ENCOUNTER — Other Ambulatory Visit: Payer: Self-pay | Admitting: Endocrinology

## 2018-11-30 ENCOUNTER — Other Ambulatory Visit: Payer: Self-pay

## 2018-11-30 ENCOUNTER — Encounter: Payer: Self-pay | Admitting: Endocrinology

## 2018-11-30 ENCOUNTER — Ambulatory Visit: Payer: PRIVATE HEALTH INSURANCE | Admitting: Endocrinology

## 2018-11-30 VITALS — BP 120/74 | HR 89 | Ht 63.0 in | Wt 243.2 lb

## 2018-11-30 DIAGNOSIS — E782 Mixed hyperlipidemia: Secondary | ICD-10-CM | POA: Diagnosis not present

## 2018-11-30 DIAGNOSIS — E039 Hypothyroidism, unspecified: Secondary | ICD-10-CM | POA: Diagnosis not present

## 2018-11-30 DIAGNOSIS — E1165 Type 2 diabetes mellitus with hyperglycemia: Secondary | ICD-10-CM

## 2018-11-30 DIAGNOSIS — Z794 Long term (current) use of insulin: Secondary | ICD-10-CM

## 2018-11-30 LAB — LIPID PANEL
Cholesterol: 164 mg/dL (ref 0–200)
HDL: 45.6 mg/dL (ref >39.00)
LDL Cholesterol: 94 mg/dL (ref 0–99)
NonHDL: 117.94
Total CHOL/HDL Ratio: 4
Triglycerides: 120 mg/dL (ref 0.0–149.0)
VLDL: 24 mg/dL (ref 0.0–40.0)

## 2018-11-30 LAB — COMPREHENSIVE METABOLIC PANEL
ALT: 31 U/L (ref 0–35)
AST: 21 U/L (ref 0–37)
Albumin: 4.3 g/dL (ref 3.5–5.2)
Alkaline Phosphatase: 78 U/L (ref 39–117)
BUN: 19 mg/dL (ref 6–23)
CO2: 27 mEq/L (ref 19–32)
Calcium: 9.5 mg/dL (ref 8.4–10.5)
Chloride: 102 mEq/L (ref 96–112)
Creatinine, Ser: 0.51 mg/dL (ref 0.40–1.20)
GFR: 124 mL/min (ref >60.00)
Glucose, Bld: 178 mg/dL — ABNORMAL HIGH (ref 70–99)
Potassium: 4.4 mEq/L (ref 3.5–5.1)
Sodium: 139 mEq/L (ref 135–145)
Total Bilirubin: 0.6 mg/dL (ref 0.2–1.2)
Total Protein: 7.2 g/dL (ref 6.0–8.3)

## 2018-11-30 NOTE — Progress Notes (Signed)
Patient ID: Kara Dawson, female   DOB: April 19, 1961, 58 y.o.   MRN: [id number]          Reason for Appointment: Endocrinology follow-up  Referring physician: Ehinger   History of Present Illness:          Date of diagnosis of type 2 diabetes mellitus: 2004        Background history:   She has been on various oral hyperglycemic agents including metformin, Janumet and Amaryl in the past Apparently her blood sugars were fairly well controlled until 2012 when her A1c was 7.5 and subsequently it has been mostly higher above 8% She has tried other agents including Invokana and Marcelline Deist as well as Bydureon However her A1c has not improved with these changes She apparently had not lost weight with Bydureon and blood sugars were not better  Recent history:       Her baseline A1c was 9.3 in April 2018 Most recent A1c is 7.8  Non-insulin hypoglycemic drugs the patient is taking are: Metformin 1000 twice a day, Amaryl 4 mg p.m., Invokana 300 mg daily  INJECTABLE drugs: SOLIQUA 32 units total daily, 10 units a.m. and 22 in p.m.  Current management, blood sugar patterns and problems identified:  Her average blood sugar at home is 184 compared to 152 previously  Fasting readings were previously variably and occasionally normal also  Also she was tending to have difficulty watching her diet with snacks and portion  However by trying to switch her Soliqua partly to the evening her morning sugars are higher than before and more consistently high  She is dealing with the stress of her mother's death about a month ago and having nausea with insomnia  She is also not deciding whether she is trying to do a little walking  She does not think she is eating large portions or meals but may be nibbling on snacks and carbohydrates during the day  She has done more readings AFTER her evening meal and these are not overall higher than in the morning; some days sugars are higher overnight  Her weight  has gone up slightly  Glucose readings in the afternoon are relatively lower.       Side effects from medications have been: None  Compliance with the medical regimen: Fair  Glucose monitoring:  done at least 1  times a day         Glucometer:  One Touch       Blood Glucose readings by review of download   PRE-MEAL Fasting Lunch Dinner Bedtime Overall  Glucose range:  165-231   144-175 149-241   Mean/median:  188   173  180  184    previous readings:  PRE-MEAL Fasting Lunch Dinner Bedtime Overall  Glucose range:  98-227  131, 134  109    Mean/median: 171     152   POST-MEAL PC Breakfast PC Lunch PC Dinner  Glucose range:  100-209  ?  Mean/median:       Self-care: The diet that the patient has been following is: tries to limit Sugar intake .       Typical meal intake: Breakfast is protein shake or egg and toast               Dietician visit, most recent: 2013               Weight history: Previous range 180-281  Wt Readings from Last 3 Encounters:  11/30/18 243 lb 3.2  oz (110.3 kg)  09/27/18 240 lb (108.9 kg)  06/15/18 236 lb 6.4 oz (107.2 kg)    Glycemic control:    Lab Results  Component Value Date   HGBA1C 7.8 (A) 09/27/2018   HGBA1C 7.3 (A) 06/15/2018   HGBA1C 7.3 (A) 02/09/2018   Lab Results  Component Value Date   MICROALBUR 0.7 02/09/2018   LDLCALC 103 (H) 10/06/2017   CREATININE 0.49 09/27/2018   Lab Results  Component Value Date   MICRALBCREAT 1.1 02/09/2018    No results found for: FRUCTOSAMINE   Other active problems: See review of systems   Allergies as of 11/30/2018      Reactions   Penicillins Rash      Medication List       Accurate as of Nov 30, 2018  8:41 AM. If you have any questions, ask your nurse or doctor.        aspirin EC 81 MG tablet Take 81 mg by mouth daily.   atorvastatin 80 MG tablet Commonly known as:  LIPITOR TAKE ONE-HALF TABLET BY MOUTH EVERY DAY FOR CHOLESTEROL   ezetimibe 10 MG tablet Commonly  known as:  Zetia Take 1 tablet (10 mg total) by mouth daily. Take along with atorvastatin   glimepiride 4 MG tablet Commonly known as:  AMARYL TAKE 1 TABLET BY MOUTH 2 TIMES A DAY WITH FOOD FOR DIABETES What changed:  See the new instructions.   Insulin Glargine-Lixisenatide 100-33 UNT-MCG/ML Sopn Commonly known as:  Soliqua Inject 32 Units into the skin daily. Inject 32 units under the skin once daily. What changed:    when to take this  additional instructions   Invokana 300 MG Tabs tablet Generic drug:  canagliflozin TAKE 1 TABLET BY MOUTH ONCE DAILY BEFORE BREAKFAST   L-Lysine 500 MG Caps Take by mouth. Takes one daily   levothyroxine 50 MCG tablet Commonly known as:  SYNTHROID TAKE 1 TABLET BY MOUTH EVERY MORNING ON AN EMPTY STOMACH   magnesium gluconate 500 MG tablet Commonly known as:  MAGONATE Take 500 mg by mouth daily.   metFORMIN 1000 MG tablet Commonly known as:  GLUCOPHAGE TAKE 1 TABLET BY MOUTH 2 TIMES A DAY WITH A MEAL   MULTIPLE VITAMIN PO Take by mouth. Takes one vitamin daily   OneTouch Verio test strip Generic drug:  glucose blood USE TO TEST BLOOD SUGAR TWICE DAILY DX CODE E11.65   Vitamin D3 25 MCG (1000 UT) Caps Take by mouth. Takes one daily in the evening.       Allergies:  Allergies  Allergen Reactions  . Penicillins Rash    Past Medical History:  Diagnosis Date  . Allergy   . Diabetes mellitus without complication (HCC)   . Hyperlipidemia   . Hypertension   . Thyroid disease     History reviewed. No pertinent surgical history.  Family History  Problem Relation Age of Onset  . Diabetes Mother   . Diabetes Father   . Heart disease Father     Social History:  reports that she has never smoked. She has never used smokeless tobacco. No history on file for alcohol and drug.   Review of Systems   Lipid history: Last LDL was above 100, getting Lipitor 40 from her PCP She thinks that 80 mg Lipitor causes muscle aches  Intolerant to Crestor Zetia was started on her visit in 3/20  Last LDL was at 130 done by PCP   Lab Results  Component Value Date  CHOL 186 10/06/2017   HDL 49.50 10/06/2017   LDLCALC 103 (H) 10/06/2017   TRIG 167.0 (H) 10/06/2017   CHOLHDL 4 10/06/2017           Most recent eye exam was 12/2017  Most recent foot exam: 7/19  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 Calcium normal although as high as 10.2  Bone density in 2014 was reportedly normal has not had a follow-up  On 2000 U D3 for supplementation with adequate levels  Lab Results  Component Value Date   PTH 12 (L) 02/09/2018   CALCIUM 9.9 09/27/2018     Mild hypothyroidism: She has been on 50 mcg of levothyroxine for several years, not clear if she had baseline hypothyroidism  Has been on a stable dose of levothyroxine  LABS:   Lab Results  Component Value Date   TSH 1.56 09/27/2018     Physical Examination:  BP 120/74 (BP Location: Left Arm, Patient Position: Sitting, Cuff Size: Normal)   Pulse 89   Ht 5\' 3"  (1.6 m)   Wt 243 lb 3.2 oz (110.3 kg)   SpO2 94%   BMI 43.08 kg/m     ASSESSMENT:  Diabetes type 2, with Obesity  See history of present illness for detailed discussion of current diabetes management, blood sugar patterns and problems identified  Last A1c was 7.8  Currently on multiple medications including Soliqua, Invokana, Amaryl 4 mg once a day and metformin  Her blood sugars are higher fasting more consistently now. She does not appear to have higher readings after meals although not checking enough Most likely she does need increased doses of basal insulin from current blood sugar patterns  However for simplicity she can go up further on her Lawana Chambers as this may help her with weight loss also she currently has no nausea Currently has difficulties with recent stress problem her mother's death as well as dealing with the pandemic diet can be better also   At this time she is able to get her brand-name prescriptions with assistance from the manufacturer's including the Invokana However not clear if her insurance will prefer [place] after July  History of hyperparathyroidism: Recommend that she have follow-up bone density once she can go safely for the procedure   LIPIDS: With adding Zetia to her Lipitor need follow-up labs Last LDL 130   PLAN:    She was given a flowsheet to adjust her Soliqua in the evening, explained to her how to work this  She will go up to 26 units for now and then increase by 2 units every 3 days until morning sugars are around 930 or having consistent nausea  She will call if she has difficulties achieving her goal otherwise follow-up in 2 months  She may otherwise need a separate injection of basal insulin if she has nausea  Continue Invokana, needs follow-up chemistry panel today  She needs to have lipids rechecked today As above needs bone density screening Thyroid levels to be checked next visit Encourage her to start walking more regularly and for longer periods of time for exercise  She needs to discuss situational depression and insomnia with her PCP  There are no Patient Instructions on file for this visit.   Total visit time for evaluation and management of multiple problems and counseling =25 minutes    Reather Littler 11/30/2018, 8:41 AM   Note: This office note was prepared with Dragon voice recognition system technology. Any transcriptional errors that result from  this process are unintentional.

## 2018-11-30 NOTE — Patient Instructions (Signed)
Check blood sugars on waking up days a week  Also check blood sugars about 2 hours after meals and do this after different meals by rotation  Recommended blood sugar levels on waking up are 90-130 and about 2 hours after meal is 130-160  Please bring your blood sugar monitor to each visit, thank you   

## 2018-11-30 NOTE — Progress Notes (Signed)
Please call to let patient know that the cholesterol results are normal and no change needed, other labs okay except glucose

## 2018-12-21 ENCOUNTER — Other Ambulatory Visit: Payer: Self-pay | Admitting: Endocrinology

## 2018-12-22 ENCOUNTER — Other Ambulatory Visit: Payer: Self-pay | Admitting: Family Medicine

## 2018-12-22 DIAGNOSIS — E2839 Other primary ovarian failure: Secondary | ICD-10-CM

## 2019-01-11 ENCOUNTER — Other Ambulatory Visit: Payer: Self-pay | Admitting: Endocrinology

## 2019-01-12 ENCOUNTER — Encounter: Payer: Self-pay | Admitting: Endocrinology

## 2019-01-12 LAB — HM DIABETES EYE EXAM

## 2019-01-30 NOTE — Progress Notes (Signed)
Patient ID: Kara Dawson, female   DOB: 12/01/1960, 58 y.o.   MRN: [id number]          Reason for Appointment: Endocrinology follow-up  Referring physician: Ehinger   History of Present Illness:          Date of diagnosis of type 2 diabetes mellitus: 2004        Background history:   She has been on various oral hyperglycemic agents including metformin, Janumet and Amaryl in the past Apparently her blood sugars were fairly well controlled until 2012 when her A1c was 7.5 and subsequently it has been mostly higher above 8% She has tried other agents including Invokana and [place] as well as Bydureon However her A1c has not improved with these changes She apparently had not lost weight with Bydureon and blood sugars were not better  Recent history:       Her baseline A1c was 9.3 in April 2018   Her recent A1c is 7.4  Non-insulin hypoglycemic drugs the patient is taking are: Metformin 1000 twice a day, Amaryl 4 mg p.m., Invokana 300 mg daily  INJECTABLE drugs: SOLIQUA  10 units a.m. and 34 in p.m.  Current management, blood sugar patterns and problems identified:  Her Soliqua in the evening was increased on the last visit and she continued to increase it on her own and has gone up another 8 units, most of her changes were done in May 2020  She was able to use the titration sheet and get her blood sugars are mostly under 130  Average blood sugar at home is much better at 128 compared to 184  She is trying to be a little more active  With less stress she is able to try and focus better on cutting back on portions and reducing snacks  She said that if she has a small snack at bedtime her sugars may be somewhat better in the morning  FASTING blood sugars are as low as 93 now compared to as high as 231 previously  As before she does not check readings after meals very much but recent readings are all below 150 nonfasting  She is not doing much formal exercise but is trying to  be generally more active  Weight is down 3 pounds       Side effects from medications have been: None  Compliance with the medical regimen: Fair  Glucose monitoring:  done at least 1  times a day         Glucometer:  One Touch       Blood Glucose readings by review of download  FASTING range 97-157 AVERAGE in the morning 128  Nonfasting range 93-146  Previous readings:  PRE-MEAL Fasting Lunch Dinner Bedtime Overall  Glucose range:  165-231   144-175 149-241   Mean/median:  188   173  180  184    Self-care:      Typical meal intake: Breakfast is protein shake or egg and toast               Dietician visit, most recent: 2013               Weight history: Previous range 180-281  Wt Readings from Last 3 Encounters:  01/31/19 240 lb 12.8 oz (109.2 kg)  11/30/18 243 lb 3.2 oz (110.3 kg)  09/27/18 240 lb (108.9 kg)    Glycemic control:    Lab Results  Component Value Date   HGBA1C 7.4 (A) 01/31/2019  HGBA1C 7.8 (A) 09/27/2018   HGBA1C 7.3 (A) 06/15/2018   Lab Results  Component Value Date   MICROALBUR 0.7 02/09/2018   LDLCALC 94 11/30/2018   CREATININE 0.51 11/30/2018   Lab Results  Component Value Date   MICRALBCREAT 1.1 02/09/2018    No results found for: FRUCTOSAMINE   Other active problems: See review of systems   Allergies as of 01/31/2019      Reactions   Penicillins Rash      Medication List       Accurate as of January 31, 2019  8:46 AM. If you have any questions, ask your nurse or doctor.        aspirin EC 81 MG tablet Take 81 mg by mouth daily.   atorvastatin 80 MG tablet Commonly known as: LIPITOR TAKE ONE-HALF TABLET BY MOUTH EVERY DAY FOR CHOLESTEROL   ezetimibe 10 MG tablet Commonly known as: ZETIA TAKE 1 TABLET BY MOUTH DAILY. TAKE ALONG WITH ATORVASTATIN   glimepiride 4 MG tablet Commonly known as: AMARYL TAKE 1 TABLET BY MOUTH 2 TIMES A DAY WITH FOOD FOR DIABETES What changed: See the new instructions.   Insulin  Glargine-Lixisenatide 100-33 UNT-MCG/ML Sopn Commonly known as: Soliqua Inject 32 Units into the skin daily. Inject 32 units under the skin once daily. What changed:   how much to take  when to take this  additional instructions   Invokana 300 MG Tabs tablet Generic drug: canagliflozin TAKE 1 TABLET BY MOUTH ONCE DAILY BEFORE BREAKFAST   L-Lysine 500 MG Caps Take by mouth. Takes one daily   levothyroxine 50 MCG tablet Commonly known as: SYNTHROID TAKE 1 TABLET BY MOUTH EVERY MORNING ON AN EMPTY STOMACH   magnesium gluconate 500 MG tablet Commonly known as: MAGONATE Take 500 mg by mouth daily.   metFORMIN 1000 MG tablet Commonly known as: GLUCOPHAGE TAKE 1 TABLET BY MOUTH 2 TIMES A DAY WITH A MEAL   MULTIPLE VITAMIN PO Take by mouth. Takes one vitamin daily   OneTouch Verio test strip Generic drug: glucose blood USE TO TEST BLOOD SUGAR TWICE DAILY DX CODE E11.65   Vitamin D3 25 MCG (1000 UT) Caps Take by mouth. Takes one daily in the evening.       Allergies:  Allergies  Allergen Reactions  . Penicillins Rash    Past Medical History:  Diagnosis Date  . Allergy   . Diabetes mellitus without complication (HCC)   . Hyperlipidemia   . Hypertension   . Thyroid disease     History reviewed. No pertinent surgical history.  Family History  Problem Relation Age of Onset  . Diabetes Mother   . Diabetes Father   . Heart disease Father     Social History:  reports that she has never smoked. She has never used smokeless tobacco. No history on file for alcohol and drug.   Review of Systems   Lipid history: Last LDL was above 100, getting Lipitor 40 from her PCP She thinks that 80 mg Lipitor causes muscle aches Intolerant to Crestor Zetia was started on her visit in 3/20  Previous LDL was at 130 done by PCP, follow-up below 100   Lab Results  Component Value Date   CHOL 164 11/30/2018   HDL 45.60 11/30/2018   LDLCALC 94 11/30/2018   TRIG 120.0  11/30/2018   CHOLHDL 4 11/30/2018           Most recent eye exam was 12/2018  Most recent foot exam: 7/19  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 Calcium normal on the last measurement, previously as high as 10.2  Bone density in 2014 was reportedly normal, has been scheduled for follow-up  On 2000 U D3 for supplementation with adequate levels  Lab Results  Component Value Date   PTH 12 (L) 02/09/2018   CALCIUM 9.5 11/30/2018     Mild hypothyroidism: She has been on 50 mcg of levothyroxine for several years, not clear if she had baseline hypothyroidism  Has been on a stable dose of levothyroxine  LABS:   Lab Results  Component Value Date   TSH 1.56 09/27/2018    Taking Ativan as needed for anxiety and sleep  Physical Examination:  BP 130/70 (BP Location: Left Arm, Patient Position: Sitting, Cuff Size: Large)   Pulse 84   Ht 5\' 3"  (1.6 m)   Wt 240 lb 12.8 oz (109.2 kg)   SpO2 95%   BMI 42.66 kg/m     ASSESSMENT:  Diabetes type 2, with Obesity  See history of present illness for detailed discussion of current diabetes management, blood sugar patterns and problems identified  Her A1c is 7.4  Currently on multiple medications including Soliqua, Invokana, Amaryl 4 mg once a day and metformin  With increasing her Soliqua to a total of 44 units her fasting readings are significantly better She is also able to keep her weight down with generally managing her diet better and trying to be a little more active She is adapting to her lower stress level better also  Currently also taking Invokana She is having somewhat variable but generally good control of her morning readings averaging below 130  History of hyperparathyroidism: Bone density pending  No history of hypertension   PLAN:   She we will continue to adjust her Lawana ChambersSoliqua if needed Meanwhile as long as her sugars are generally below 130 she will continue the same total dose  of 44 units Also continue to monitor postprandial readings at times She will try to walk as much as possible Follow-up A1c in 3 months We will repeat her labs including renal function and thyroid levels on the next visit  There are no Patient Instructions on file for this visit.       Reather LittlerAjay Shylo Dillenbeck 01/31/2019, 8:46 AM   Note: This office note was prepared with Dragon voice recognition system technology. Any transcriptional errors that result from this process are unintentional.

## 2019-01-31 ENCOUNTER — Ambulatory Visit: Payer: PRIVATE HEALTH INSURANCE | Admitting: Endocrinology

## 2019-01-31 ENCOUNTER — Other Ambulatory Visit: Payer: Self-pay

## 2019-01-31 ENCOUNTER — Encounter: Payer: Self-pay | Admitting: Endocrinology

## 2019-01-31 VITALS — BP 130/70 | HR 84 | Ht 63.0 in | Wt 240.8 lb

## 2019-01-31 DIAGNOSIS — E1165 Type 2 diabetes mellitus with hyperglycemia: Secondary | ICD-10-CM

## 2019-01-31 DIAGNOSIS — Z794 Long term (current) use of insulin: Secondary | ICD-10-CM | POA: Diagnosis not present

## 2019-01-31 LAB — POCT GLYCOSYLATED HEMOGLOBIN (HGB A1C): Hemoglobin A1C: 7.4 % — AB (ref 4.0–5.6)

## 2019-01-31 NOTE — Patient Instructions (Signed)
Amaryl 1/2 in am

## 2019-02-01 ENCOUNTER — Other Ambulatory Visit: Payer: Self-pay | Admitting: Endocrinology

## 2019-02-07 ENCOUNTER — Telehealth: Payer: Self-pay | Admitting: Endocrinology

## 2019-02-07 ENCOUNTER — Other Ambulatory Visit: Payer: Self-pay

## 2019-02-07 ENCOUNTER — Ambulatory Visit
Admission: RE | Admit: 2019-02-07 | Discharge: 2019-02-07 | Disposition: A | Discharge status: Home / Self Care | Payer: PRIVATE HEALTH INSURANCE | Source: Ambulatory Visit | Attending: Family Medicine | Admitting: Family Medicine

## 2019-02-07 DIAGNOSIS — E2839 Other primary ovarian failure: Secondary | ICD-10-CM

## 2019-02-07 NOTE — Telephone Encounter (Signed)
Patient called re:Insulin Glargine-Lixisenatide (SOLIQUA) 100-33 UNT-MCG/ML SOPN (patient was taking 32 units per day - now patient is up to 44 units per day) but on the RX she is only getting 3 pens which is not enough. Patient needs a RX for at least 5 pens sent to [id number] - [place], [place] [phone] (Phone) [phone] (Fax)   In order for patient to bridge the gap and have enough medication.  Also-Invokana costs $575.00 which patient cannot afford. Patient has enough Invokana to last 1 week. Patient is inquiring is there a lower cost substitute for Invokana or a discount card/program?. Patient's insurance will not cover [place]. Please call patient at ph# [phone] to advise.If patient is not available patient requests you speak with her husband Kara Dawson at ph# [phone] who handles her prescriptions, etc.

## 2019-02-08 ENCOUNTER — Other Ambulatory Visit: Payer: Self-pay | Admitting: Endocrinology

## 2019-02-08 ENCOUNTER — Telehealth: Payer: Self-pay

## 2019-02-08 ENCOUNTER — Other Ambulatory Visit: Payer: Self-pay

## 2019-02-08 MED ORDER — SOLIQUA 100-33 UNT-MCG/ML ~~LOC~~ SOPN: 32.0000 [IU] | PEN_INJECTOR | Freq: Two times a day (BID) | SUBCUTANEOUS | 1 refills | Status: DC

## 2019-02-08 NOTE — Telephone Encounter (Signed)
Corrected Rx has been sent.

## 2019-02-08 NOTE — Telephone Encounter (Signed)
Called [facility] and initiated PA for [employer].  PA was approved from 02/08/2019 until 02/08/2020. PA approval #: [id number]

## 2019-03-06 ENCOUNTER — Telehealth: Payer: Self-pay | Admitting: Endocrinology

## 2019-03-06 ENCOUNTER — Other Ambulatory Visit: Payer: Self-pay

## 2019-03-06 MED ORDER — JARDIANCE 25 MG PO TABS: 25.0000 mg | ORAL_TABLET | Freq: Every day | ORAL | 2 refills | Status: DC

## 2019-03-06 NOTE — Telephone Encounter (Signed)
Rx sent 

## 2019-03-06 NOTE — Telephone Encounter (Signed)
Pharmacy is requesting to speak with a nurse. Did not advise reason.

## 2019-03-06 NOTE — Telephone Encounter (Signed)
Called pharmacy back and pharmacist stated that the pt changed insurance companies and the new insurance company does not cover [name], but they cover [name]. Pharmacist has already spoke to the pt and she is aware that Kara Dawson is not covered anymore. Would you like to change?

## 2019-03-06 NOTE — Telephone Encounter (Signed)
She will change to [name] 25 mg daily but if she starts having higher blood sugars she should let us know

## 2019-03-07 NOTE — Telephone Encounter (Signed)
PA initiated via [email or web address] for [name].  Aminat [place] Key: [id number] - [id number] - Rx #: [phone]  Status: PA Request - Sent to Plan on August 18th, 2020  Drug: [name] 25MG  tablets  Form: [facility] Electronic Prior Authorization Form (2017 NCPDP)  Problem with this form? Call us at [phone].

## 2019-03-08 NOTE — Telephone Encounter (Signed)
PA for [name] approved.  PA good through 03/06/2020.

## 2019-03-30 ENCOUNTER — Telehealth: Payer: Self-pay | Admitting: Endocrinology

## 2019-03-30 NOTE — Telephone Encounter (Signed)
Called pt and husband answered the phone and he stated that he had called on the pt's behalf because she works for [employer], and it is hard for her to get time to call herself. Pt's husband was informed that Dr. Dwyane Dee will require a few days worth of blood sugar readings in order to make adjustments. Husband verbalized understanding and stated that he would have the pt return the call and give [place] readings when she is available.

## 2019-03-30 NOTE — Telephone Encounter (Signed)
Patient called to advise that the [name] does not seem to be controlling her blood sugars since having to switch from the [place].  Needs further assistance with lowering patients blood sugars  Please advise [phone]

## 2019-04-15 ENCOUNTER — Other Ambulatory Visit: Payer: Self-pay | Admitting: Endocrinology

## 2019-05-03 NOTE — Progress Notes (Signed)
Patient ID: Kara Dawson, female   DOB: 1960/09/22, 58 y.o.   MRN: [id number]          Reason for Appointment: Endocrinology follow-up  Referring physician: Ehinger   History of Present Illness:          Date of diagnosis of type 2 diabetes mellitus: 2004        Background history:   She has been on various oral hyperglycemic agents including metformin, Janumet and Amaryl in the past Apparently her blood sugars were fairly well controlled until 2012 when her A1c was 7.5 and subsequently it has been mostly higher above 8% She has tried other agents including Invokana and Marcelline Deist as well as Bydureon However her A1c has not improved with these changes She apparently had not lost weight with Bydureon and blood sugars were not better  Recent history:       Her baseline A1c was 9.3 in April 2018   Her A1c is 8.2 compared to 7.4  Non-insulin hypoglycemic drugs the patient is taking are: Metformin 1000 twice a day, Amaryl 2 mg p.m.,  [name] 25 daily  INJECTABLE drugs: SOLIQUA  10 units a.m. and 34 in p.m.  Current management, blood sugar patterns and problems identified:  Her Soliqua dose has been continued unchanged since her last visit  However because of insurance preference she was switched from [place] to [place] in the mornings  She thinks that right after that change her blood sugar started going up  She feels as before checking blood sugars mostly in the morning and forgets to do them later in the day  She has some variability in the blood sugars but recently they are mostly high in the morning especially compared to her last visit  Blood sugars are almost the same at suppertime but does not have more than 1 high reading at night; she forgets to do readings after supper  Generally she thinks she is trying to plan her meals well with low calories and carbohydrates  She has been unable to lose weight  She is not exercising for various reasons  She is fairly  regular with her injections and other medications       Side effects from medications have been: None  Compliance with the medical regimen: Fair  Glucose monitoring:  done at least 1  times a day         Glucometer:  One Touch       Blood Glucose readings by review of download   PRE-MEAL Fasting Lunch Dinner Bedtime Overall  Glucose range:  127-202   144-173  117-204   Mean/median:  166   157  143  162   Previous readings:  FASTING range 97-157 AVERAGE in the morning 128  Nonfasting range 93-146   Self-care:      Typical meal intake: Breakfast is protein shake or egg and toast               Dietician visit, most recent: 2013               Weight history: Previous range 180-281  Wt Readings from Last 3 Encounters:  05/04/19 243 lb 9.6 oz (110.5 kg)  01/31/19 240 lb 12.8 oz (109.2 kg)  11/30/18 243 lb 3.2 oz (110.3 kg)    Glycemic control:    Lab Results  Component Value Date   HGBA1C 8.2 (A) 05/04/2019   HGBA1C 7.4 (A) 01/31/2019   HGBA1C 7.8 (A) 09/27/2018   Lab  Results  Component Value Date   MICROALBUR 0.7 02/09/2018   LDLCALC 94 11/30/2018   CREATININE 0.51 11/30/2018   Lab Results  Component Value Date   MICRALBCREAT 1.1 02/09/2018    No results found for: FRUCTOSAMINE   Other active problems: See review of systems   Allergies as of 05/04/2019      Reactions   Penicillins Rash      Medication List       Accurate as of May 04, 2019  9:55 AM. If you have any questions, ask your nurse or doctor.        aspirin EC 81 MG tablet Take 81 mg by mouth daily.   atorvastatin 80 MG tablet Commonly known as: LIPITOR TAKE ONE-HALF TABLET BY MOUTH EVERY DAY FOR CHOLESTEROL   canagliflozin 300 MG Tabs tablet Commonly known as: Invokana Take 1 tablet (300 mg total) by mouth daily before breakfast. Started by: Elayne Snare, MD   ezetimibe 10 MG tablet Commonly known as: ZETIA TAKE 1 TABLET BY MOUTH DAILY. TAKE ALONG WITH ATORVASTATIN    glimepiride 2 MG tablet Commonly known as: AMARYL Take 1 tablet (2 mg total) by mouth daily. Take 1 tablet by mouth in the evening. What changed: Another medication with the same name was removed. Continue taking this medication, and follow the directions you see here. Changed by: Elayne Snare, MD   [name] 25 MG Tabs tablet Generic drug: empagliflozin Take 25 mg by mouth daily. Take 1 tablet by mouth once daily. **Call MD if blood sugars begin to increase.**   L-Lysine 500 MG Caps Take by mouth. Takes one daily   levothyroxine 50 MCG tablet Commonly known as: SYNTHROID TAKE 1 TABLET BY MOUTH EVERY MORNING ON AN EMPTY STOMACH   magnesium gluconate 500 MG tablet Commonly known as: MAGONATE Take 500 mg by mouth daily.   metFORMIN 1000 MG tablet Commonly known as: GLUCOPHAGE TAKE 1 TABLET BY MOUTH 2 TIMES A DAY WITH A MEAL   MULTIPLE VITAMIN PO Take by mouth. Takes one vitamin daily   OneTouch Verio test strip Generic drug: glucose blood USE TO TEST BLOOD SUGAR TWICE DAILY DX CODE E11.65   Soliqua 100-33 UNT-MCG/ML Sopn Generic drug: Insulin Glargine-Lixisenatide Inject into the skin 2 (two) times daily. Inject 10 units under the skin in the morning, and 34 units in the evening. What changed: Another medication with the same name was removed. Continue taking this medication, and follow the directions you see here. Changed by: Elayne Snare, MD   Vitamin D3 25 MCG (1000 UT) Caps Take by mouth. Takes one daily in the evening.       Allergies:  Allergies  Allergen Reactions  . Penicillins Rash    Past Medical History:  Diagnosis Date  . Allergy   . Diabetes mellitus without complication ([place])   . Hyperlipidemia   . Hypertension   . Thyroid disease     History reviewed. No pertinent surgical history.  Family History  Problem Relation Age of Onset  . Diabetes Mother   . Diabetes Father   . Heart disease Father     Social History:  reports that she has never  smoked. She has never used smokeless tobacco. No history on file for alcohol and drug.   Review of Systems   Lipid history: Last LDL was above 100, getting Lipitor 80 mg and using half tablet  She thinks that 80 mg Lipitor causes muscle aches Intolerant to Crestor Zetia was started on her visit  in 3/20  Previous LDL was at 130 done by PCP, follow-up below 100   Lab Results  Component Value Date   CHOL 164 11/30/2018   HDL 45.60 11/30/2018   LDLCALC 94 11/30/2018   TRIG 120.0 11/30/2018   CHOLHDL 4 11/30/2018           Most recent eye exam was 12/2018  Most recent foot exam: 7/19  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 Calcium normal on the last measurement, previously as high as 10.2  Bone density in 2020 was again normal  On 2000 U D3 for supplementation with adequate levels  Lab Results  Component Value Date   PTH 12 (L) 02/09/2018   CALCIUM 9.5 11/30/2018     Mild hypothyroidism: She has been on 50 mcg of levothyroxine for several years, not clear if she had baseline hypothyroidism  Has been on a stable dose of levothyroxine  LABS:   Lab Results  Component Value Date   TSH 1.56 09/27/2018    She is on Ativan as needed for anxiety and sleep from PCP  Physical Examination:  BP 126/70 (BP Location: Left Arm, Patient Position: Sitting, Cuff Size: Normal)   Pulse 86   Ht  (1.6 m)   Wt 243 lb 9.6 oz (110.5 kg)   SpO2 97%   BMI 43.15 kg/m   No pedal edema present  ASSESSMENT:  Diabetes type 2, with morbid obesity  See history of present illness for detailed discussion of current diabetes management, blood sugar patterns and problems identified  Her A1c is 8.2, previously 7.4  Still not controlled multiple medications including Soliqua, [name], Amaryl 4 mg once a day and metformin 1 g twice daily  Patient thinks that her blood sugars are higher with switching from [name] to Amaryl A1c has gone up also She  appears to have mostly high fasting readings and also some high readings at dinnertime Not checking enough readings after meals however Although she is generally trying to watch her diet she is not exercising  She is on relatively high doses of Soliqua but not on the maximum dose as yet  History of hyperparathyroidism: Bone density recently normal and can continue to be monitored every 5 years  Mild hypothyroidism: Needs follow-up labs  No history of hypertension   PLAN:   We will try to get prior authorization for Invokana since this was more effective with her diabetes control and improved A1c and better weight maintenance compared to [name] Meanwhile she can take her [name] in the evening instead of morning  She will increase Soliqua to 38 units in the evening Discussed that we need to continue adjusting it to keep her morning sugars down averaging about 120 Also since she is only taking half a tablet of Amaryl she will go up to 2 mg at dinnertime  Discussed importance of exercise to bring her weight down To check blood sugars at various times including after supper more often  Follow-up A1c in 3 months We will repeat her labs including renal function and thyroid levels today  Influenza vaccine given, patient information given with this  Patient Instructions  Soliqua 38 units  Walk daily  Check blood sugars on waking up 5 days a week  Also check blood sugars about 2 hours after meals and do this after different meals by rotation  Recommended blood sugar levels on waking up are 90-130 and about 2 hours after meal is 130-160  Please bring your blood  sugar monitor to each visit, thank you  Amaryl 2mg  at dinner with jardiance   Total visit time for evaluation and management of multiple problems and counseling =25 minutes     Reather LittlerAjay Lj Miyamoto 05/04/2019, 9:55 AM   Note: This office note was prepared with Dragon voice recognition system technology. Any transcriptional  errors that result from this process are unintentional.

## 2019-05-04 ENCOUNTER — Other Ambulatory Visit: Payer: Self-pay

## 2019-05-04 ENCOUNTER — Ambulatory Visit: Payer: PRIVATE HEALTH INSURANCE | Admitting: Endocrinology

## 2019-05-04 ENCOUNTER — Encounter: Payer: Self-pay | Admitting: Endocrinology

## 2019-05-04 VITALS — BP 126/70 | HR 86 | Ht 63.0 in | Wt 243.6 lb

## 2019-05-04 DIAGNOSIS — E1165 Type 2 diabetes mellitus with hyperglycemia: Secondary | ICD-10-CM | POA: Diagnosis not present

## 2019-05-04 DIAGNOSIS — E039 Hypothyroidism, unspecified: Secondary | ICD-10-CM

## 2019-05-04 DIAGNOSIS — Z794 Long term (current) use of insulin: Secondary | ICD-10-CM

## 2019-05-04 LAB — COMPREHENSIVE METABOLIC PANEL
ALT: 29 U/L (ref 0–35)
AST: 17 U/L (ref 0–37)
Albumin: 4.5 g/dL (ref 3.5–5.2)
Alkaline Phosphatase: 79 U/L (ref 39–117)
BUN: 21 mg/dL (ref 6–23)
CO2: 28 mEq/L (ref 19–32)
Calcium: 9.8 mg/dL (ref 8.4–10.5)
Chloride: 103 mEq/L (ref 96–112)
Creatinine, Ser: 0.55 mg/dL (ref 0.40–1.20)
GFR: 113.49 mL/min (ref >60.00)
Glucose, Bld: 163 mg/dL — ABNORMAL HIGH (ref 70–99)
Potassium: 4.5 mEq/L (ref 3.5–5.1)
Sodium: 140 mEq/L (ref 135–145)
Total Bilirubin: 0.6 mg/dL (ref 0.2–1.2)
Total Protein: 7.5 g/dL (ref 6.0–8.3)

## 2019-05-04 LAB — MICROALBUMIN / CREATININE URINE RATIO
Creatinine,U: 41.8 mg/dL
Microalb Creat Ratio: 1.7 mg/g (ref 0.0–30.0)
Microalb, Ur: <0.7 mg/dL (ref 0.0–1.9)

## 2019-05-04 LAB — URINALYSIS, ROUTINE W REFLEX MICROSCOPIC
Bilirubin Urine: NEGATIVE
Hgb urine dipstick: NEGATIVE
Ketones, ur: NEGATIVE
Leukocytes,Ua: NEGATIVE
Nitrite: NEGATIVE
RBC / HPF: NONE SEEN (ref 0–?)
Specific Gravity, Urine: 1.02 (ref 1.000–1.030)
Total Protein, Urine: NEGATIVE
Urine Glucose: >=1000 — AB
Urobilinogen, UA: 0.2 (ref 0.0–1.0)
pH: 5.5 (ref 5.0–8.0)

## 2019-05-04 LAB — TSH: TSH: 2.01 u[IU]/mL (ref 0.35–4.50)

## 2019-05-04 LAB — POCT GLYCOSYLATED HEMOGLOBIN (HGB A1C): Hemoglobin A1C: 8.2 % — AB (ref 4.0–5.6)

## 2019-05-04 LAB — T4, FREE: Free T4: 1.01 ng/dL (ref 0.60–1.60)

## 2019-05-04 MED ORDER — CANAGLIFLOZIN 300 MG PO TABS: 300.0000 mg | ORAL_TABLET | Freq: Every day | ORAL | 3 refills | Status: DC

## 2019-05-04 MED ORDER — GLIMEPIRIDE 2 MG PO TABS: 2.0000 mg | ORAL_TABLET | Freq: Every day | ORAL | 3 refills | Status: DC

## 2019-05-04 NOTE — Patient Instructions (Addendum)
Soliqua 38 units  Walk daily  Check blood sugars on waking up 5 days a week  Also check blood sugars about 2 hours after meals and do this after different meals by rotation  Recommended blood sugar levels on waking up are 90-130 and about 2 hours after meal is 130-160  Please bring your blood sugar monitor to each visit, thank you  Amaryl 2mg  at dinner with jardiance

## 2019-05-16 ENCOUNTER — Telehealth: Payer: Self-pay | Admitting: Endocrinology

## 2019-05-16 NOTE — Telephone Encounter (Signed)
Mickel Baas with [facility] ph# [phone] called to request a PA be done for RX for Invokana (per [employer]). Fax# [phone].

## 2019-05-17 NOTE — Telephone Encounter (Signed)
Approvedtoday Request Reference Number: [id number]. INVOKANA TAB 300MG  is approved through 05/16/2020. For further questions, call [phone].

## 2019-05-17 NOTE — Telephone Encounter (Signed)
PA initiated via [email or web address] for Invokana 300mg  tablets, taking once daily. [place] Go Key: [id number] - PA Case ID: [id number] Need help? Call us at [phone] Status Sent to [place] 300MG  tablets Form [employer] Electronic Prior Authorization Form (2017 NCPDP)

## 2019-06-14 ENCOUNTER — Other Ambulatory Visit: Payer: Self-pay | Admitting: Endocrinology

## 2019-07-04 LAB — VITAMIN D 25 HYDROXY (VIT D DEFICIENCY, FRACTURES): Vit D, 25-Hydroxy: 39.7

## 2019-07-04 LAB — LIPID PANEL
Cholesterol: 152 (ref 0–200)
HDL: 47 (ref 35–70)
Triglycerides: 105 (ref 40–160)

## 2019-07-31 ENCOUNTER — Other Ambulatory Visit: Payer: Self-pay

## 2019-08-02 ENCOUNTER — Encounter: Payer: Self-pay | Admitting: Endocrinology

## 2019-08-02 ENCOUNTER — Other Ambulatory Visit: Payer: Self-pay

## 2019-08-02 ENCOUNTER — Ambulatory Visit: Payer: PRIVATE HEALTH INSURANCE | Admitting: Endocrinology

## 2019-08-02 VITALS — BP 138/80 | HR 104 | Ht 63.0 in | Wt 243.0 lb

## 2019-08-02 DIAGNOSIS — E063 Autoimmune thyroiditis: Secondary | ICD-10-CM | POA: Diagnosis not present

## 2019-08-02 DIAGNOSIS — E1165 Type 2 diabetes mellitus with hyperglycemia: Secondary | ICD-10-CM

## 2019-08-02 DIAGNOSIS — E039 Hypothyroidism, unspecified: Secondary | ICD-10-CM | POA: Diagnosis not present

## 2019-08-02 DIAGNOSIS — Z794 Long term (current) use of insulin: Secondary | ICD-10-CM

## 2019-08-02 LAB — COMPREHENSIVE METABOLIC PANEL
ALT: 27 U/L (ref 0–35)
AST: 18 U/L (ref 0–37)
Albumin: 4.5 g/dL (ref 3.5–5.2)
Alkaline Phosphatase: 77 U/L (ref 39–117)
BUN: 20 mg/dL (ref 6–23)
CO2: 29 mEq/L (ref 19–32)
Calcium: 10.5 mg/dL (ref 8.4–10.5)
Chloride: 99 mEq/L (ref 96–112)
Creatinine, Ser: 0.5 mg/dL (ref 0.40–1.20)
GFR: 126.57 mL/min (ref >60.00)
Glucose, Bld: 118 mg/dL — ABNORMAL HIGH (ref 70–99)
Potassium: 4.2 mEq/L (ref 3.5–5.1)
Sodium: 138 mEq/L (ref 135–145)
Total Bilirubin: 0.5 mg/dL (ref 0.2–1.2)
Total Protein: 7.9 g/dL (ref 6.0–8.3)

## 2019-08-02 LAB — T4, FREE: Free T4: 0.97 ng/dL (ref 0.60–1.60)

## 2019-08-02 LAB — POCT GLYCOSYLATED HEMOGLOBIN (HGB A1C): Hemoglobin A1C: 8 % — AB (ref 4.0–5.6)

## 2019-08-02 LAB — TSH: TSH: 2.58 u[IU]/mL (ref 0.35–4.50)

## 2019-08-02 NOTE — Patient Instructions (Addendum)
Check blood sugars on waking up 4 days a week  Also check blood sugars about 2 hours after meals and do this after different meals by rotation  Recommended blood sugar levels on waking up are 90-130 and about 2 hours after meal is 130-160  Please bring your blood sugar monitor to each visit, thank you  Glimeperide 1 1/2 at bedtime  Soliqua before supper

## 2019-08-02 NOTE — Progress Notes (Signed)
Patient ID: Kara Dawson, female   DOB: 1961-06-13, 59 y.o.   MRN: [id number]          Reason for Appointment: Endocrinology follow-up  Referring physician: Ehinger   History of Present Illness:          Date of diagnosis of type 2 diabetes mellitus: 2004        Background history:   She has been on various oral hyperglycemic agents including metformin, Janumet and Amaryl in the past Apparently her blood sugars were fairly well controlled until 2012 when her A1c was 7.5 and subsequently it has been mostly higher above 8% She has tried other agents including Invokana and Marcelline Deist as well as Bydureon However her A1c has not improved with these changes She apparently had not lost weight with Bydureon and blood sugars were not better  Recent history:       Her baseline A1c was 9.3 in April 2018   Her A1c is 8%, was 8.2 Lowest A1c was 6.8 in 2018  Non-insulin hypoglycemic drugs the patient is taking are: Metformin 1000 twice a day, Amaryl 2 mg p.m.,   INJECTABLE drugs: SOLIQUA  10 units a.m. and 38 in p.m.  Current management, blood sugar patterns and problems identified:  Her Soliqua dose has been increased in the evening on her last visit  Also she was able to get back on Invokana instead of [name] in November  However her fasting blood sugars are consistently high  She is forgetting to check readings after dinner but over the last 3 months these are variable and occasionally just over 200  Only today her FASTING blood sugar was relatively better at 102 but she checked it a couple of hours early at 5 AM, otherwise readings after about 6-7 AM are usually more than 150  Blood sugars before dinner are mildly increased  She takes her evening [place] a couple of hours after dinner because she wants to space the 2 injections 12 hours apart  Currently taking Amaryl at dinnertime and Invokana in the morning  She has just started to do a little walking and she thinks she can  increase this  She has been unable to lose weight        Side effects from medications have been: None  Compliance with the medical regimen: Fair  Glucose monitoring:  done at least 1  times a day         Glucometer:  One Touch       Blood Glucose readings by review of download   PRE-MEAL Fasting Lunch Dinner Bedtime Overall  Glucose range:  102-234   117-157    Mean/median:  181   135  155   POST-MEAL PC Breakfast PC Lunch PC Dinner  Glucose range:  238   180, 206  Mean/median:      PREVIOUS readings:  PRE-MEAL Fasting Lunch Dinner Bedtime Overall  Glucose range:  127-202   144-173  117-204   Mean/median:  166   157  143  162    Self-care:      Typical meal intake: Breakfast is protein shake or egg and toast               Dietician visit, most recent: 2013               Weight history: Previous range 180-281  Wt Readings from Last 3 Encounters:  08/02/19 243 lb (110.2 kg)  05/04/19 243 lb 9.6 oz (110.5 kg)  01/31/19 240 lb 12.8 oz (109.2 kg)    Glycemic control:    Lab Results  Component Value Date   HGBA1C 8.0 (A) 08/02/2019   HGBA1C 8.2 (A) 05/04/2019   HGBA1C 7.4 (A) 01/31/2019   Lab Results  Component Value Date   MICROALBUR <0.7 05/04/2019   LDLCALC 94 11/30/2018   CREATININE 0.55 05/04/2019   Lab Results  Component Value Date   MICRALBCREAT 1.7 05/04/2019    No results found for: FRUCTOSAMINE   Other active problems: See review of systems   Allergies as of 08/02/2019      Reactions   Penicillins Rash      Medication List       Accurate as of August 02, 2019  3:06 PM. If you have any questions, ask your nurse or doctor.        aspirin EC 81 MG tablet Take 81 mg by mouth daily.   atorvastatin 80 MG tablet Commonly known as: LIPITOR TAKE ONE-HALF TABLET BY MOUTH EVERY DAY FOR CHOLESTEROL   canagliflozin 300 MG Tabs tablet Commonly known as: Invokana Take 1 tablet (300 mg total) by mouth daily before breakfast.   ezetimibe  10 MG tablet Commonly known as: ZETIA TAKE 1 TABLET BY MOUTH DAILY. TAKE ALONG WITH ATORVASTATIN   glimepiride 2 MG tablet Commonly known as: AMARYL Take 1 tablet (2 mg total) by mouth daily. Take 1 tablet by mouth in the evening.   [name] 25 MG Tabs tablet Generic drug: empagliflozin Take 25 mg by mouth daily. Take 1 tablet by mouth once daily. **Call MD if blood sugars begin to increase.**   L-Lysine 500 MG Caps Take by mouth. Takes one daily   levothyroxine 50 MCG tablet Commonly known as: SYNTHROID TAKE 1 TABLET BY MOUTH EVERY MORNING ON AN EMPTY STOMACH   magnesium gluconate 500 MG tablet Commonly known as: MAGONATE Take 500 mg by mouth daily.   metFORMIN 1000 MG tablet Commonly known as: GLUCOPHAGE TAKE 1 TABLET BY MOUTH 2 TIMES A DAY WITH A MEAL   MULTIPLE VITAMIN PO Take by mouth. Takes one vitamin daily   OneTouch Verio test strip Generic drug: glucose blood USE TO TEST BLOOD SUGAR TWICE DAILY DX CODE E11.65   Soliqua 100-33 UNT-MCG/ML Sopn Generic drug: Insulin Glargine-Lixisenatide Inject into the skin 2 (two) times daily. Inject 10 units under the skin in the morning, and 34 units in the evening.   Vitamin D3 25 MCG (1000 UT) Caps Take by mouth. Takes one daily in the evening.       Allergies:  Allergies  Allergen Reactions  . Penicillins Rash    Past Medical History:  Diagnosis Date  . Allergy   . Diabetes mellitus without complication ([place])   . Hyperlipidemia   . Hypertension   . Thyroid disease     No past surgical history on file.  Family History  Problem Relation Age of Onset  . Diabetes Mother   . Diabetes Father   . Heart disease Father     Social History:  reports that she has never smoked. She has never used smokeless tobacco. No history on file for alcohol and drug.   Review of Systems   Lipid history: Last LDL was above 100, getting Lipitor 80 mg and using half tablet  She thinks that 80 mg Lipitor causes muscle  aches Intolerant to Crestor Zetia was started on her visit in 3/20  Previous LDL was at 130 done by PCP, follow-up below 100  Lab Results  Component Value Date   CHOL 152 07/04/2019   HDL 47 07/04/2019   LDLCALC 94 11/30/2018   TRIG 105 07/04/2019   CHOLHDL 4 11/30/2018           Most recent eye exam was 12/2018  Most recent foot exam: 7/19  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 Calcium normal on the last measurement, previously as high as 10.2  Bone density in 2020 was again normal  On 2000 U D3 for supplementation with adequate levels  Lab Results  Component Value Date   PTH 12 (L) 02/09/2018   CALCIUM 9.8 05/04/2019     Mild hypothyroidism: She has been on 50 mcg of levothyroxine for several years, not clear if she had baseline hypothyroidism  Has been on a stable dose of levothyroxine  LABS:   Lab Results  Component Value Date   TSH 2.01 05/04/2019    She is on Ativan as needed for anxiety and sleep from PCP  Physical Examination:  BP 138/80   Pulse (!) 104   Ht 5\' 3"  (1.6 m)   Wt 243 lb (110.2 kg)   SpO2 95%   BMI 43.05 kg/m   No pedal edema present  ASSESSMENT:  Diabetes type 2, with morbid obesity  See history of present illness for detailed discussion of current diabetes management, blood sugar patterns and problems identified  Her A1c is 8.0, previously 7.4  Current regimen: Soliqua total 48 units/day, Invokana 300 mg, Amaryl 2 mg once a day and metformin 1 g twice daily  Despite starting back on Invokana on her last visit her blood sugars do not appear to be any better Today discussed that the A1c is still high and likely from a combination of high readings after dinner at times and at least in the morning hours Also unable to lose weight She thinks she is improving her diet and is starting to do a little walking However most of her high readings are fasting and possibly after dinner   History of  hypercalcemia:, Needs follow-up  Mild hypothyroidism: Needs follow-up labs  LIPIDS: Followed by PCP   PLAN:   She will use the Contour meter which she thinks may be more accurate.   Will need to continue current basic regimen However if her blood sugar is can be checked after dinnertime this will enable to decide if she needs mealtime insulin separately in the evening In the meantime she can switch her Soliqua to 30 minutes before dinner instead of a couple of hours later Since her suppertime readings are also somewhat high she will maintain her supper dose in the morning of Soliqua Also to help fasting readings she can try taking her Amaryl at bedtime and increase the dose to 1-1/2 tablets Continue trying to improve diet and exercise regimen Labs to be checked today  Follow-up in 2 months   Patient Instructions  Check blood sugars on waking up 4 days a week  Also check blood sugars about 2 hours after meals and do this after different meals by rotation  Recommended blood sugar levels on waking up are 90-130 and about 2 hours after meal is 130-160  Please bring your blood sugar monitor to each visit, thank you  Glimeperide 1 1/2 at bedtime  [place] before supper             Lawana Chambers 08/02/2019, 3:06 PM   Note: This office note was prepared with Dragon voice recognition system  technology. Any transcriptional errors that result from this process are unintentional.

## 2019-08-03 ENCOUNTER — Telehealth: Payer: Self-pay

## 2019-08-03 ENCOUNTER — Other Ambulatory Visit: Payer: Self-pay

## 2019-08-03 MED ORDER — ONETOUCH VERIO VI STRP: ORAL_STRIP | 4 refills | Status: AC

## 2019-08-03 NOTE — Telephone Encounter (Signed)
MEDICATION: contour next test strips   PHARMACY:  [id number] - [place], [place] - [address]  IS THIS A 90 DAY SUPPLY : yes   IS PATIENT OUT OF MEDICATION:   IF NOT; HOW MUCH IS LEFT:   LAST APPOINTMENT DATE: @1 /13/2021  NEXT APPOINTMENT DATE:@3 /26/2021  DO WE HAVE YOUR PERMISSION TO LEAVE A DETAILED MESSAGE:  OTHER COMMENTS:    **Let patient know to contact pharmacy at the end of the day to make sure medication is ready. **  ** Please notify patient to allow 48-72 hours to process**  **Encourage patient to contact the pharmacy for refills or they can request refills through [facility]**

## 2019-08-03 NOTE — Telephone Encounter (Signed)
Rx sent 

## 2019-08-04 ENCOUNTER — Other Ambulatory Visit: Payer: Self-pay | Admitting: Endocrinology

## 2019-08-07 ENCOUNTER — Other Ambulatory Visit: Payer: Self-pay

## 2019-08-07 ENCOUNTER — Telehealth: Payer: Self-pay

## 2019-08-07 MED ORDER — GLIMEPIRIDE 2 MG PO TABS: ORAL_TABLET | ORAL | 3 refills | Status: DC

## 2019-08-07 MED ORDER — CONTOUR NEXT TEST VI STRP: ORAL_STRIP | 2 refills | Status: AC

## 2019-08-07 NOTE — Telephone Encounter (Signed)
Patient also states that Dr changed her Rx for glimepiride (AMARYL) 2 MG tablet so she needs a new Rx sent to the pharmacy also.   MEDICATION: contour next test strips   PHARMACY:  [id number] - [place], [place] - [address]  IS THIS A 90 DAY SUPPLY :   IS PATIENT OUT OF MEDICATION:   IF NOT; HOW MUCH IS LEFT:   LAST APPOINTMENT DATE: @1 /15/2021  NEXT APPOINTMENT DATE:@3 /26/2021  DO WE HAVE YOUR PERMISSION TO LEAVE A DETAILED MESSAGE:  OTHER COMMENTS:    **Let patient know to contact pharmacy at the end of the day to make sure medication is ready. **  ** Please notify patient to allow 48-72 hours to process**  **Encourage patient to contact the pharmacy for refills or they can request refills through [facility]**

## 2019-08-07 NOTE — Telephone Encounter (Signed)
Rx updated for Amaryl and Rx for Contour test strips sent to pharmacy.

## 2019-08-10 ENCOUNTER — Ambulatory Visit: Payer: PRIVATE HEALTH INSURANCE | Admitting: Endocrinology

## 2019-09-12 ENCOUNTER — Other Ambulatory Visit: Payer: Self-pay | Admitting: Endocrinology

## 2019-10-13 ENCOUNTER — Other Ambulatory Visit: Payer: Self-pay | Admitting: Endocrinology

## 2019-10-13 ENCOUNTER — Ambulatory Visit: Payer: PRIVATE HEALTH INSURANCE | Admitting: Endocrinology

## 2019-10-13 ENCOUNTER — Other Ambulatory Visit: Payer: Self-pay

## 2019-10-13 ENCOUNTER — Encounter: Payer: Self-pay | Admitting: Endocrinology

## 2019-10-13 VITALS — BP 124/70 | HR 91 | Ht 63.0 in | Wt 245.2 lb

## 2019-10-13 DIAGNOSIS — Z794 Long term (current) use of insulin: Secondary | ICD-10-CM

## 2019-10-13 DIAGNOSIS — E1165 Type 2 diabetes mellitus with hyperglycemia: Secondary | ICD-10-CM | POA: Diagnosis not present

## 2019-10-13 DIAGNOSIS — E039 Hypothyroidism, unspecified: Secondary | ICD-10-CM | POA: Diagnosis not present

## 2019-10-13 DIAGNOSIS — E7800 Pure hypercholesterolemia, unspecified: Secondary | ICD-10-CM

## 2019-10-13 LAB — BASIC METABOLIC PANEL WITH GFR
BUN: 20 mg/dL (ref 6–23)
CO2: 29 meq/L (ref 19–32)
Calcium: 9.2 mg/dL (ref 8.4–10.5)
Chloride: 103 meq/L (ref 96–112)
Creatinine, Ser: 0.53 mg/dL (ref 0.40–1.20)
GFR: 118.26 mL/min
Glucose, Bld: 138 mg/dL — ABNORMAL HIGH (ref 70–99)
Potassium: 4.2 meq/L (ref 3.5–5.1)
Sodium: 140 meq/L (ref 135–145)

## 2019-10-13 NOTE — Patient Instructions (Signed)
SOLIQUA 50 AT supper from 3/27  Check blood sugars on waking up 3 days a week  Also check blood sugars about 2 hours after meals and do this after different meals by rotation  Recommended blood sugar levels on waking up are 90-130 and about 2 hours after meal is 130-160  Please bring your blood sugar monitor to each visit, thank you

## 2019-10-13 NOTE — Progress Notes (Signed)
Patient ID: Kara Dawson, female   DOB: [birth year], 59 y.o.   MRN: [id number]          Reason for Appointment: Endocrinology follow-up  Referring physician: Ehinger   History of Present Illness:          Date of diagnosis of type 2 diabetes mellitus: 2004        Background history:   She has been on various oral hyperglycemic agents including metformin, Janumet and Amaryl in the past Apparently her blood sugars were fairly well controlled until 2012 when her A1c was 7.5 and subsequently it has been mostly higher above 8% She has tried other agents including Invokana and Wilder Glade as well as Bydureon However her A1c has not improved with these changes She apparently had not lost weight with Bydureon and blood sugars were not better  Her baseline A1c was 9.3 in April 2018   Recent history:       Her A1c in 1/21 was 8%,  Lowest A1c was 6.8 in 2018  Non-insulin hypoglycemic drugs the patient is taking are: Metformin 1000 twice a day, Amaryl 3 mg p.m., Invokana 300 mg daily  INJECTABLE drugs: SOLIQUA  10 units a.m. and 38 in p.m.  Current management, blood sugar patterns and problems identified:  Her Soliqua dose has been changed to suppertime instead of late evening previously  Also she was told to increase Amaryl to 1-1/2 tablets and try taking it at bedtime but she forgets to do this and is taking at suppertime again  Blood sugars are over at home are still relatively high fasting  Previously her blood sugars on an average would be usually the highest in the mornings but occasionally after supper also  She is only rarely checking blood sugars later in the day  As before her exercise regimen has been intermittent and not much lately  She has been unable to lose weight        Side effects from medications have been: None  Compliance with the medical regimen: Fair  Glucose monitoring:  done at least 1  times a day         Glucometer:  One Touch       Blood Glucose  readings by review of download  FASTING range 142-194, afternoon 144 AVERAGE 151  Previous readings:   PRE-MEAL Fasting Lunch Dinner Bedtime Overall  Glucose range:  102-234   117-157    Mean/median:  181   135  155   POST-MEAL PC Breakfast PC Lunch PC Dinner  Glucose range:  238   180, 206  Mean/median:       Self-care:      Typical meal intake: Breakfast is protein shake or egg and toast               Dietician visit, most recent: 2013               Weight history: Previous range 180-281  Wt Readings from Last 3 Encounters:  10/13/19 245 lb 3.2 oz (111.2 kg)  08/02/19 243 lb (110.2 kg)  05/04/19 243 lb 9.6 oz (110.5 kg)    Glycemic control:    Lab Results  Component Value Date   HGBA1C 8.0 (A) 08/02/2019   HGBA1C 8.2 (A) 05/04/2019   HGBA1C 7.4 (A) 01/31/2019   Lab Results  Component Value Date   MICROALBUR <0.7 05/04/2019   LDLCALC 94 11/30/2018   CREATININE 0.53 10/13/2019   Lab Results  Component Value Date  MICRALBCREAT 1.7 05/04/2019    Lab Results  Component Value Date   FRUCTOSAMINE 255 10/13/2019     Other active problems: See review of systems   Allergies as of 10/13/2019      Reactions   Penicillins Rash      Medication List       Accurate as of October 13, 2019 11:59 PM. If you have any questions, ask your nurse or doctor.        STOP taking these medications   [name] 25 MG Tabs tablet Generic drug: empagliflozin Stopped by: Reather Littler, MD     TAKE these medications   aspirin EC 81 MG tablet Take 81 mg by mouth daily.   atorvastatin 80 MG tablet Commonly known as: LIPITOR TAKE ONE-HALF TABLET BY MOUTH EVERY DAY FOR CHOLESTEROL   ezetimibe 10 MG tablet Commonly known as: ZETIA TAKE 1 TABLET BY MOUTH DAILY. TAKE ALONG WITH ATORVASTATIN   glimepiride 2 MG tablet Commonly known as: AMARYL Take 1 and a half tablets by mouth in the evening.   Invokana 300 MG Tabs tablet Generic drug: canagliflozin TAKE 1 TABLET  (300 MG TOTAL) BY MOUTH DAILY BEFORE BREAKFAST.   L-Lysine 500 MG Caps Take by mouth. Takes one daily   levothyroxine 50 MCG tablet Commonly known as: SYNTHROID TAKE 1 TABLET BY MOUTH EVERY MORNING ON AN EMPTY STOMACH   magnesium gluconate 500 MG tablet Commonly known as: MAGONATE Take 500 mg by mouth daily.   metFORMIN 1000 MG tablet Commonly known as: GLUCOPHAGE TAKE 1 TABLET BY MOUTH 2 TIMES A DAY WITH A MEAL   MULTIPLE VITAMIN PO Take by mouth. Takes one vitamin daily   OneTouch Verio test strip Generic drug: glucose blood USE TO TEST BLOOD SUGAR TWICE DAILY DX CODE E11.65   Contour Next Test test strip Generic drug: glucose blood Use Contour test strips as instructed to check blood sugar twice daily.   Soliqua 100-33 UNT-MCG/ML Sopn Generic drug: Insulin Glargine-Lixisenatide Inject into the skin in the morning and at bedtime. Inject 10 units under the skin in the morning and 38 units in the evening. What changed: Another medication with the same name was removed. Continue taking this medication, and follow the directions you see here. Changed by: Reather Littler, MD   Vitamin D3 25 MCG (1000 UT) Caps Take by mouth. Takes one daily in the evening.       Allergies:  Allergies  Allergen Reactions  . Penicillins Rash    Past Medical History:  Diagnosis Date  . Allergy   . Diabetes mellitus without complication (HCC)   . Hyperlipidemia   . Hypertension   . Thyroid disease     History reviewed. No pertinent surgical history.  Family History  Problem Relation Age of Onset  . Diabetes Mother   . Diabetes Father   . Heart disease Father     Social History:  reports that she has never smoked. She has never used smokeless tobacco. No history on file for alcohol and drug.   Review of Systems   Lipid history: Last LDL was above 100, getting Lipitor 80 mg and using half tablet Apparently 80 mg Lipitor dose causes muscle aches Intolerant to Crestor Zetia was  started on her visit in 3/20  Labs as follows:   Lab Results  Component Value Date   CHOL 152 07/04/2019   HDL 47 07/04/2019   LDLCALC 94 11/30/2018   TRIG 105 07/04/2019   CHOLHDL 4 11/30/2018  Most recent eye exam was 12/2018  Most recent foot exam: 7/19  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 Calcium normal on the last measurement, previously as high as 10.2  Bone density in 2020 was normal  On 2000 U D3 for supplementation with adequate levels  Lab Results  Component Value Date   PTH 12 (L) 02/09/2018   CALCIUM 9.2 10/13/2019     Mild hypothyroidism: She has been on 50 mcg of levothyroxine for several years, not clear if she had baseline hypothyroidism  Has been on a stable dose of levothyroxine  LABS:   Lab Results  Component Value Date   TSH 2.58 08/02/2019     Physical Examination:  BP 124/70 (BP Location: Left Arm, Patient Position: Sitting, Cuff Size: Normal)   Pulse 91   Ht 5\' 3"  (1.6 m)   Wt 245 lb 3.2 oz (111.2 kg)   SpO2 96%   BMI 43.44 kg/m   Diabetic Foot Exam - Simple   Simple Foot Form Diabetic Foot exam was performed with the following findings: Yes   Visual Inspection No deformities, no ulcerations, no other skin breakdown bilaterally: Yes Sensation Testing Intact to touch and monofilament testing bilaterally: Yes Pulse Check Posterior Tibialis and Dorsalis pulse intact bilaterally: Yes Comments      ASSESSMENT:  Diabetes type 2, with morbid obesity  See history of present illness for detailed discussion of current diabetes management, blood sugar patterns and problems identified  Her A1c was last 8.0, previously 7.4  Current regimen: Soliqua in divided doses, Invokana 300 mg, Amaryl 3 mg mg once a day and metformin 1 g twice daily  Not clear if her blood sugars are improving, they are still relatively high in the mornings and averaging about the same at 162 She is not checking  readings after meals despite reminders Not clear if she needs mealtime insulin coverage with her main meal Also can do better with more consistent exercise and weight loss  Foot exam normal today  Mild hypothyroidism: Previously stable, usually asymptomatic, needs follow-up labs  LIPIDS: At target as of 12/20   PLAN:   She will switch to once a day dose of the Soliqua instead of morning and evening She will take 50 units before suppertime Emphasized the need to check blood sugars after dinner and she will need to call 1/21 if they are consistently over 160 Consider mealtime insulin at that time Mild in the meantime stay with 3 mg Amaryl at dinnertime along with Invokana and Metformin as before Try to walk at least every other day or other exercise  Labs to be checked today including fructosamine, thyroid levels and chemistry panel  Follow-up in 2 months   Patient Instructions  SOLIQUA 50 AT supper from 3/27  Check blood sugars on waking up 3 days a week  Also check blood sugars about 2 hours after meals and do this after different meals by rotation  Recommended blood sugar levels on waking up are 90-130 and about 2 hours after meal is 130-160  Please bring your blood sugar monitor to each visit, thank you           4/27 10/15/2019, 8:58 PM   Note: This office note was prepared with Dragon voice recognition system technology. Any transcriptional errors that result from this process are unintentional.

## 2019-10-14 LAB — FRUCTOSAMINE: Fructosamine: 255 umol/L (ref 0–285)

## 2020-01-01 ENCOUNTER — Other Ambulatory Visit: Payer: Self-pay | Admitting: Endocrinology

## 2020-01-02 ENCOUNTER — Other Ambulatory Visit: Payer: Self-pay

## 2020-01-02 ENCOUNTER — Encounter: Payer: Self-pay | Admitting: Endocrinology

## 2020-01-02 ENCOUNTER — Ambulatory Visit: Payer: PRIVATE HEALTH INSURANCE | Admitting: Endocrinology

## 2020-01-02 VITALS — BP 112/70 | HR 93 | Ht 63.0 in | Wt 250.4 lb

## 2020-01-02 DIAGNOSIS — Z794 Long term (current) use of insulin: Secondary | ICD-10-CM | POA: Diagnosis not present

## 2020-01-02 DIAGNOSIS — E7800 Pure hypercholesterolemia, unspecified: Secondary | ICD-10-CM

## 2020-01-02 DIAGNOSIS — E039 Hypothyroidism, unspecified: Secondary | ICD-10-CM

## 2020-01-02 DIAGNOSIS — E559 Vitamin D deficiency, unspecified: Secondary | ICD-10-CM

## 2020-01-02 DIAGNOSIS — E1165 Type 2 diabetes mellitus with hyperglycemia: Secondary | ICD-10-CM

## 2020-01-02 LAB — LIPID PANEL
Cholesterol: 150 mg/dL (ref 0–200)
HDL: 43.1 mg/dL (ref >39.00)
LDL Cholesterol: 71 mg/dL (ref 0–99)
NonHDL: 107.05
Total CHOL/HDL Ratio: 3
Triglycerides: 178 mg/dL — ABNORMAL HIGH (ref 0.0–149.0)
VLDL: 35.6 mg/dL (ref 0.0–40.0)

## 2020-01-02 LAB — COMPREHENSIVE METABOLIC PANEL
ALT: 20 U/L (ref 0–35)
AST: 17 U/L (ref 0–37)
Albumin: 4.3 g/dL (ref 3.5–5.2)
Alkaline Phosphatase: 73 U/L (ref 39–117)
BUN: 20 mg/dL (ref 6–23)
CO2: 28 mEq/L (ref 19–32)
Calcium: 9.3 mg/dL (ref 8.4–10.5)
Chloride: 101 mEq/L (ref 96–112)
Creatinine, Ser: 0.48 mg/dL (ref 0.40–1.20)
GFR: 132.49 mL/min (ref >60.00)
Glucose, Bld: 146 mg/dL — ABNORMAL HIGH (ref 70–99)
Potassium: 4.4 mEq/L (ref 3.5–5.1)
Sodium: 137 mEq/L (ref 135–145)
Total Bilirubin: 0.6 mg/dL (ref 0.2–1.2)
Total Protein: 7.2 g/dL (ref 6.0–8.3)

## 2020-01-02 LAB — POCT GLYCOSYLATED HEMOGLOBIN (HGB A1C): Hemoglobin A1C: 8.1 % — AB (ref 4.0–5.6)

## 2020-01-02 LAB — TSH: TSH: 1.69 u[IU]/mL (ref 0.35–4.50)

## 2020-01-02 LAB — VITAMIN D 25 HYDROXY (VIT D DEFICIENCY, FRACTURES): VITD: 41.94 ng/mL (ref 30.00–100.00)

## 2020-01-02 LAB — T4, FREE: Free T4: 1.04 ng/dL (ref 0.60–1.60)

## 2020-01-02 NOTE — Progress Notes (Signed)
Patient ID: Kara Dawson, female   DOB: 1961/03/20, 59 y.o.   MRN: [id number]          Reason for Appointment: Endocrinology follow-up  Referring physician: Ehinger   History of Present Illness:          Date of diagnosis of type 2 diabetes mellitus: 2004        Background history:   She has been on various oral hyperglycemic agents including metformin, Janumet and Amaryl in the past Apparently her blood sugars were fairly well controlled until 2012 when her A1c was 7.5 and subsequently it has been mostly higher above 8% She has tried other agents including Invokana and Marcelline Deist as well as Bydureon However her A1c has not improved with these changes She apparently had not lost weight with Bydureon and blood sugars were not better  Her baseline A1c was 9.3 in April 2018   Recent history:       Lowest A1c was 6.8 in 2018  Non-insulin hypoglycemic drugs the patient is taking are: Metformin 1000 twice a day, Amaryl 3 mg p.m., Invokana 300 mg daily  INJECTABLE drugs: SOLIQUA 50 units in p.m.  Current management, blood sugar patterns and problems identified:  A1c is consistently high around 8% and now 8.1   Her Soliqua dose has been changed to 50 units in the evening at suppertime  However despite this her blood sugars are still persistently high in the morning and about the same as before  She is not checking blood sugars much after meals but she does have a couple of high readings over 200 earlier this month  The last couple of readings at bedtime are fairly good  However not clear if she is having high readings after breakfast or lunch  She is taking her Invokana regularly and is getting patient assistance for this  Usually not able to do much formal exercise and is only just doing activities at home  She says that she has had more stress   She has been unable to lose weight and she is up about 7 pounds this year        Dinner is usually 6 pm  Side effects from  medications have been: None  Compliance with the medical regimen: Fair  Glucose monitoring:  done at least 1  times a day         Glucometer:  One Touch       Blood Glucose readings by review of download   PRE-MEAL Fasting Lunch Dinner Bedtime Overall  Glucose range:  143-174   205  98-219   Mean/median:      157    Previous readings:  FASTING range 142-194, afternoon 144 AVERAGE 151    Self-care:      Typical meal intake: Breakfast is protein shake or egg and toast               Dietician visit, most recent: 2013               Weight history: Previous range 180-281  Wt Readings from Last 3 Encounters:  01/02/20 250 lb 6.4 oz (113.6 kg)  10/13/19 245 lb 3.2 oz (111.2 kg)  08/02/19 243 lb (110.2 kg)    Glycemic control:    Lab Results  Component Value Date   HGBA1C 8.1 (A) 01/02/2020   HGBA1C 8.0 (A) 08/02/2019   HGBA1C 8.2 (A) 05/04/2019   Lab Results  Component Value Date   MICROALBUR <0.7 05/04/2019  LDLCALC 94 11/30/2018   CREATININE 0.53 10/13/2019   Lab Results  Component Value Date   MICRALBCREAT 1.7 05/04/2019    Lab Results  Component Value Date   FRUCTOSAMINE 255 10/13/2019     Other active problems: See review of systems   Allergies as of 01/02/2020      Reactions   Penicillins Rash      Medication List       Accurate as of January 02, 2020 10:38 AM. If you have any questions, ask your nurse or doctor.        aspirin EC 81 MG tablet Take 81 mg by mouth daily.   atorvastatin 80 MG tablet Commonly known as: LIPITOR TAKE ONE-HALF TABLET BY MOUTH EVERY DAY FOR CHOLESTEROL   ezetimibe 10 MG tablet Commonly known as: ZETIA TAKE 1 TABLET BY MOUTH DAILY. TAKE ALONG WITH ATORVASTATIN   glimepiride 2 MG tablet Commonly known as: AMARYL Take 1 and a half tablets by mouth in the evening.   Invokana 300 MG Tabs tablet Generic drug: canagliflozin TAKE 1 TABLET (300 MG TOTAL) BY MOUTH DAILY BEFORE BREAKFAST.   L-Lysine 500 MG  Caps Take by mouth. Takes one daily   levothyroxine 50 MCG tablet Commonly known as: SYNTHROID TAKE 1 TABLET BY MOUTH EVERY MORNING ON AN EMPTY STOMACH   magnesium gluconate 500 MG tablet Commonly known as: MAGONATE Take 500 mg by mouth daily.   metFORMIN 1000 MG tablet Commonly known as: GLUCOPHAGE TAKE 1 TABLET BY MOUTH 2 TIMES A DAY WITH A MEAL   MULTIPLE VITAMIN PO Take by mouth. Takes one vitamin daily   OneTouch Verio test strip Generic drug: glucose blood USE TO TEST BLOOD SUGAR TWICE DAILY DX CODE E11.65   Contour Next Test test strip Generic drug: glucose blood Use Contour test strips as instructed to check blood sugar twice daily.   Soliqua 100-33 UNT-MCG/ML Sopn Generic drug: Insulin Glargine-Lixisenatide Inject into the skin in the morning and at bedtime. Inject 50 units with afternoon meal   Vitamin D3 25 MCG (1000 UT) Caps Take by mouth. Takes one daily in the evening.       Allergies:  Allergies  Allergen Reactions  . Penicillins Rash    Past Medical History:  Diagnosis Date  . Allergy   . Diabetes mellitus without complication (HCC)   . Hyperlipidemia   . Hypertension   . Thyroid disease     No past surgical history on file.  Family History  Problem Relation Age of Onset  . Diabetes Mother   . Diabetes Father   . Heart disease Father     Social History:  reports that she has never smoked. She has never used smokeless tobacco. No history on file for alcohol use and drug use.   Review of Systems   Lipid history: On statin treatment: Lipitor 80 mg and using half tablet  Apparently 80 mg Lipitor dose causes muscle aches Intolerant to Crestor Zetia was started on her visit in 3/20  Labs as follows:   Lab Results  Component Value Date   CHOL 152 07/04/2019   HDL 47 07/04/2019   LDLCALC 94 11/30/2018   TRIG 105 07/04/2019   CHOLHDL 4 11/30/2018           Most recent eye exam was 12/2018  Most recent foot exam:  6/20  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 No recurrence of hypercalcemia  She has been concerned about recurrence of hyperparathyroidism  Bone density  in 2020 was normal  On 4000 U D3 for supplementation with previously adequate levels  Lab Results  Component Value Date   PTH 12 (L) 02/09/2018   CALCIUM 9.2 10/13/2019     Mild hypothyroidism: She has been on 50 mcg of levothyroxine for several years, not clear if she had baseline hypothyroidism  Has been on a stable dose of levothyroxine  LABS:   Lab Results  Component Value Date   TSH 2.58 08/02/2019     Physical Examination:  BP 112/70 (BP Location: Left Arm, Patient Position: Sitting, Cuff Size: Large)   Pulse 93   Ht 5\' 3"  (1.6 m)   Wt 250 lb 6.4 oz (113.6 kg)   SpO2 96%   BMI 44.36 kg/m     ASSESSMENT:  Diabetes type 2, with morbid obesity  See history of present illness for detailed discussion of current diabetes management, blood sugar patterns and problems identified  Her A1c is consistently around 8%  Current regimen: Soliqua and 50 unit  dose at dinnertime, Invokana 300 mg, Amaryl 3 mg mg once a day and metformin 1 g twice daily  Although her blood sugars are still about 150 fasting she has high A1c reflecting likely high postprandial readings She is not monitoring these much and has only a couple of recent bedtime readings With her inconsistent routines and family issues she is likely not able to consistently watch her diet and has not exercised Weight continues to increase gradually   Mild hypothyroidism: Previously stable, usually asymptomatic, needs follow-up labs  LIPIDS: At target as of 12/20, needs follow-up   PLAN:   She will check blood sugars 2 hours after meals consistently and call us in 2 weeks to decide on the mealtime insulin Likely may need to switch to Toujeo and Apidra if having high postprandial readings Otherwise may consider using Toujeo  and lixisenatide separately from the same patient assistance program Encourage her to have formal walking for exercise  We will discuss results of her labs also when available  Follow-up in 2 months   There are no Patient Instructions on file for this visit.        Elayne Snare 01/02/2020, 10:38 AM   Note: This office note was prepared with Dragon voice recognition system technology. Any transcriptional errors that result from this process are unintentional.

## 2020-01-02 NOTE — Patient Instructions (Signed)
Check blood sugars on waking up 4-5 days a week  Also check blood sugars about 2 hours after meals and do this after different meals by rotation  Recommended blood sugar levels on waking up are 90-130 and about 2 hours after meal is 130-160  Please bring your blood sugar monitor to each visit, thank you  

## 2020-01-06 ENCOUNTER — Other Ambulatory Visit: Payer: Self-pay | Admitting: Endocrinology

## 2020-02-01 ENCOUNTER — Encounter: Payer: Self-pay | Admitting: Endocrinology

## 2020-02-01 LAB — HM DIABETES EYE EXAM

## 2020-02-16 ENCOUNTER — Other Ambulatory Visit: Payer: Self-pay | Admitting: Endocrinology

## 2020-03-07 ENCOUNTER — Encounter: Payer: Self-pay | Admitting: Endocrinology

## 2020-03-07 ENCOUNTER — Other Ambulatory Visit: Payer: Self-pay

## 2020-03-07 ENCOUNTER — Ambulatory Visit: Payer: PRIVATE HEALTH INSURANCE | Admitting: Endocrinology

## 2020-03-07 VITALS — BP 130/70 | HR 84 | Ht 63.0 in | Wt 251.4 lb

## 2020-03-07 DIAGNOSIS — E1165 Type 2 diabetes mellitus with hyperglycemia: Secondary | ICD-10-CM

## 2020-03-07 DIAGNOSIS — Z794 Long term (current) use of insulin: Secondary | ICD-10-CM | POA: Diagnosis not present

## 2020-03-07 MED ORDER — TOUJEO SOLOSTAR 300 UNIT/ML ~~LOC~~ SOPN: 20.0000 [IU] | PEN_INJECTOR | Freq: Every day | SUBCUTANEOUS | 1 refills | Status: DC

## 2020-03-07 NOTE — Progress Notes (Signed)
Patient ID: Kara Dawson, female   DOB: 22-Nov-1960, 59 y.o.   MRN: [id number]          Reason for Appointment: Endocrinology follow-up  Referring physician: Ehinger   History of Present Illness:          Date of diagnosis of type 2 diabetes mellitus: 2004        Background history:   She has been on various oral hyperglycemic agents including metformin, Janumet and Amaryl in the past Apparently her blood sugars were fairly well controlled until 2012 when her A1c was 7.5 and subsequently it has been mostly higher above 8% She has tried other agents including Invokana and Marcelline Deist as well as Bydureon However her A1c has not improved with these changes She apparently had not lost weight with Bydureon and blood sugars were not better  Her baseline A1c was 9.3 in April 2018   Recent history:       Lowest A1c was 6.8 in 2018  Non-insulin hypoglycemic drugs the patient is taking are: Metformin 1000 twice a day, Amaryl 3 mg p.m., Invokana 300 mg daily  INJECTABLE drugs: SOLIQUA 50 units in p.m.  Current management, blood sugar patterns and problems identified:  A1c is consistently high around 8% and last 8.1   She was told to let us know what her postprandial readings were after her last visit but she did not call  She has 2 different glucose monitors and has only 1 available today  However she thinks that her sugars are not high after dinner, recently highest only 160 about 2 hours after eating  She does appear to have fairly consistently high readings before breakfast and dinnertime although monitoring frequency is still inadequate  She tries to do a little walking but not consistently  She still has occasional days where she is excessively hungry but otherwise she thinks she is trying to cut back on portions and snacks   She has been unable to lose weight and she is up about 8 pounds this year        Dinner is usually at 6 pm  Side effects from medications have been:  None  Compliance with the medical regimen: Fair  Glucose monitoring:  done at least 1  times a day         Glucometer:  One Touch       Blood Glucose readings by review of download   PRE-MEAL Fasting Lunch Dinner Bedtime Overall  Glucose range:  140-177   149, 180    Mean/median:  160     155   POST-MEAL PC Breakfast PC Lunch PC Dinner  Glucose range:    125-162  Mean/median:    142   Previously:  PRE-MEAL Fasting Lunch Dinner Bedtime Overall  Glucose range:  143-174   205  98-219   Mean/median:      157    Self-care:      Typical meal intake: Breakfast is protein shake or egg and toast               Dietician visit, most recent: 2013               Weight history: Previous range 180-281  Wt Readings from Last 3 Encounters:  03/07/20 251 lb 6.4 oz (114 kg)  01/02/20 250 lb 6.4 oz (113.6 kg)  10/13/19 245 lb 3.2 oz (111.2 kg)    Glycemic control:    Lab Results  Component Value Date  HGBA1C 8.1 (A) 01/02/2020   HGBA1C 8.0 (A) 08/02/2019   HGBA1C 8.2 (A) 05/04/2019   Lab Results  Component Value Date   MICROALBUR <0.7 05/04/2019   LDLCALC 71 01/02/2020   CREATININE 0.48 01/02/2020   Lab Results  Component Value Date   MICRALBCREAT 1.7 05/04/2019    Lab Results  Component Value Date   FRUCTOSAMINE 255 10/13/2019     Other active problems: See review of systems   Allergies as of 03/07/2020      Reactions   Penicillins Rash      Medication List       Accurate as of March 07, 2020  9:06 AM. If you have any questions, ask your nurse or doctor.        aspirin EC 81 MG tablet Take 81 mg by mouth daily.   atorvastatin 80 MG tablet Commonly known as: LIPITOR TAKE ONE-HALF TABLET BY MOUTH EVERY DAY FOR CHOLESTEROL   ezetimibe 10 MG tablet Commonly known as: ZETIA TAKE 1 TABLET BY MOUTH DAILY. TAKE ALONG WITH ATORVASTATIN   glimepiride 2 MG tablet Commonly known as: AMARYL Take 1 and a half tablets by mouth in the evening.   Invokana  300 MG Tabs tablet Generic drug: canagliflozin TAKE 1 TABLET (300 MG TOTAL) BY MOUTH DAILY BEFORE BREAKFAST.   L-Lysine 500 MG Caps Take by mouth. Takes one daily   levothyroxine 50 MCG tablet Commonly known as: SYNTHROID TAKE 1 TABLET BY MOUTH EVERY MORNING ON AN EMPTY STOMACH   magnesium gluconate 500 MG tablet Commonly known as: MAGONATE Take 500 mg by mouth daily.   metFORMIN 1000 MG tablet Commonly known as: GLUCOPHAGE TAKE 1 TABLET BY MOUTH 2 TIMES A DAY WITH A MEAL   MULTIPLE VITAMIN PO Take by mouth. Takes one vitamin daily   OneTouch Verio test strip Generic drug: glucose blood USE TO TEST BLOOD SUGAR TWICE DAILY DX CODE E11.65   Contour Next Test test strip Generic drug: glucose blood Use Contour test strips as instructed to check blood sugar twice daily.   Soliqua 100-33 UNT-MCG/ML Sopn Generic drug: Insulin Glargine-Lixisenatide INJECT 10 UNITS UNDER THE SKIN IN AM AND 34 UNITS IN THE AFTERNOON   Toujeo SoloStar 300 UNIT/ML Solostar Pen Generic drug: insulin glargine (1 Unit Dial) Inject 20 Units into the skin daily. Started by: Reather Littler, MD   Vitamin D3 25 MCG (1000 UT) Caps Take by mouth. Takes one daily in the evening.       Allergies:  Allergies  Allergen Reactions  . Penicillins Rash    Past Medical History:  Diagnosis Date  . Allergy   . Diabetes mellitus without complication (HCC)   . Hyperlipidemia   . Hypertension   . Thyroid disease     No past surgical history on file.  Family History  Problem Relation Age of Onset  . Diabetes Mother   . Diabetes Father   . Heart disease Father     Social History:  reports that she has never smoked. She has never used smokeless tobacco. No history on file for alcohol use and drug use.   Review of Systems  Following is a copy of the previous note   Lipid history: On statin treatment: Lipitor 80 mg and using half tablet  Apparently 80 mg Lipitor dose causes muscle aches Intolerant  to Crestor Zetia was started on her visit in 3/20  Labs as follows:   Lab Results  Component Value Date   CHOL 150 01/02/2020  HDL 43.10 01/02/2020   LDLCALC 71 01/02/2020   TRIG 178.0 (H) 01/02/2020   CHOLHDL 3 01/02/2020           Most recent eye exam was 12/2018  Most recent foot exam: 6/20  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 No recurrence of hypercalcemia  She has been concerned about recurrence of hyperparathyroidism  Bone density in 2020 was normal  On 4000 U D3 for supplementation with previously adequate levels  Lab Results  Component Value Date   PTH 12 (L) 02/09/2018   CALCIUM 9.3 01/02/2020     Mild hypothyroidism: She has been on 50 mcg of levothyroxine for several years, not clear if she had baseline hypothyroidism  Has been on a stable dose of levothyroxine  LABS:   Lab Results  Component Value Date   TSH 1.69 01/02/2020     Physical Examination:  BP 130/70 (BP Location: Left Arm, Patient Position: Sitting, Cuff Size: Normal)   Pulse 84   Ht 5\' 3"  (1.6 m)   Wt 251 lb 6.4 oz (114 kg)   SpO2 94%   BMI 44.53 kg/m     ASSESSMENT:  Diabetes type 2, with morbid obesity  See history of present illness for detailed discussion of current diabetes management, blood sugar patterns and problems identified  Her A1c is consistently around 8%  Current regimen: Soliqua 50 unit  dose at dinnertime, Invokana 300 mg, Amaryl 3 mg mg once a day and metformin 1 g twice daily  She appears to have high fasting readings and some readings high before dinnertime Likely needs more basal insulin to help to control and she is on maximum doses of Soliqua   PLAN:   She will start Toujeo 6 units at dinnertime She will titrate this by 2 units every 3 days until morning sugars are below 130 at least Prescription and coupon for free box of insulin given  No change in her medications as yet Increase walking  Follow-up in 2  months   Patient Instructions    Toujeo insulin: This insulin provides blood sugar control for up to 24 hours.   Start with 6 units at dinner daily and increase by 2 units every 3 days until the waking up sugars are under 130. Then continue the same dose.  If blood sugar is under 90 for 2 days in a row, reduce the dose by 2 units.  Note that this insulin does not control the rise of blood sugar with meals    50 Soliqua also  Check blood sugars on waking up 5-7 days a week  Also check blood sugars about 2 hours after meals and do this after different meals by rotation  Recommended blood sugar levels on waking up are 90-130 and about 2 hours after meal is 130-160  Please bring your blood sugar monitor to each visit, thank you  Walk daily           03/07/2020, 9:06 AM   Note: This office note was prepared with Dragon voice recognition system technology. Any transcriptional errors that result from this process are unintentional.

## 2020-03-07 NOTE — Patient Instructions (Addendum)
Toujeo insulin: This insulin provides blood sugar control for up to 24 hours.   Start with 6 units at dinner daily and increase by 2 units every 3 days until the waking up sugars are under 130. Then continue the same dose.  If blood sugar is under 90 for 2 days in a row, reduce the dose by 2 units.  Note that this insulin does not control the rise of blood sugar with meals    50 Soliqua also  Check blood sugars on waking up 5-7 days a week  Also check blood sugars about 2 hours after meals and do this after different meals by rotation  Recommended blood sugar levels on waking up are 90-130 and about 2 hours after meal is 130-160  Please bring your blood sugar monitor to each visit, thank you  Walk daily

## 2020-03-30 ENCOUNTER — Other Ambulatory Visit: Payer: Self-pay | Admitting: Endocrinology

## 2020-04-02 ENCOUNTER — Other Ambulatory Visit: Payer: Self-pay | Admitting: Endocrinology

## 2020-04-28 ENCOUNTER — Other Ambulatory Visit: Payer: Self-pay | Admitting: Endocrinology

## 2020-04-29 ENCOUNTER — Other Ambulatory Visit: Payer: Self-pay | Admitting: Endocrinology

## 2020-05-10 ENCOUNTER — Ambulatory Visit: Payer: PRIVATE HEALTH INSURANCE | Admitting: Endocrinology

## 2020-05-10 ENCOUNTER — Encounter: Payer: Self-pay | Admitting: Endocrinology

## 2020-05-10 ENCOUNTER — Other Ambulatory Visit: Payer: Self-pay

## 2020-05-10 VITALS — BP 124/82 | HR 82 | Wt 234.0 lb

## 2020-05-10 DIAGNOSIS — Z794 Long term (current) use of insulin: Secondary | ICD-10-CM | POA: Diagnosis not present

## 2020-05-10 DIAGNOSIS — E063 Autoimmune thyroiditis: Secondary | ICD-10-CM

## 2020-05-10 DIAGNOSIS — E1165 Type 2 diabetes mellitus with hyperglycemia: Secondary | ICD-10-CM | POA: Diagnosis not present

## 2020-05-10 DIAGNOSIS — Z23 Encounter for immunization: Secondary | ICD-10-CM | POA: Diagnosis not present

## 2020-05-10 DIAGNOSIS — E782 Mixed hyperlipidemia: Secondary | ICD-10-CM | POA: Diagnosis not present

## 2020-05-10 LAB — TSH: TSH: 2.14 u[IU]/mL (ref 0.35–4.50)

## 2020-05-10 LAB — LIPID PANEL
Cholesterol: 145 mg/dL (ref 0–200)
HDL: 44.1 mg/dL (ref >39.00)
LDL Cholesterol: 79 mg/dL (ref 0–99)
NonHDL: 100.58
Total CHOL/HDL Ratio: 3
Triglycerides: 106 mg/dL (ref 0.0–149.0)
VLDL: 21.2 mg/dL (ref 0.0–40.0)

## 2020-05-10 LAB — POCT GLYCOSYLATED HEMOGLOBIN (HGB A1C): Hemoglobin A1C: 6.6 % — AB (ref 4.0–5.6)

## 2020-05-10 LAB — COMPREHENSIVE METABOLIC PANEL
ALT: 24 U/L (ref 0–35)
AST: 21 U/L (ref 0–37)
Albumin: 4.5 g/dL (ref 3.5–5.2)
Alkaline Phosphatase: 78 U/L (ref 39–117)
BUN: 21 mg/dL (ref 6–23)
CO2: 31 mEq/L (ref 19–32)
Calcium: 9.9 mg/dL (ref 8.4–10.5)
Chloride: 103 mEq/L (ref 96–112)
Creatinine, Ser: 0.61 mg/dL (ref 0.40–1.20)
GFR: 98.07 mL/min (ref >60.00)
Glucose, Bld: 113 mg/dL — ABNORMAL HIGH (ref 70–99)
Potassium: 4.8 mEq/L (ref 3.5–5.1)
Sodium: 141 mEq/L (ref 135–145)
Total Bilirubin: 0.6 mg/dL (ref 0.2–1.2)
Total Protein: 7.3 g/dL (ref 6.0–8.3)

## 2020-05-10 LAB — MICROALBUMIN / CREATININE URINE RATIO
Creatinine,U: 53.9 mg/dL
Microalb Creat Ratio: 1.3 mg/g (ref 0.0–30.0)
Microalb, Ur: <0.7 mg/dL (ref 0.0–1.9)

## 2020-05-10 NOTE — Progress Notes (Signed)
Patient ID: Kara Dawson, female   DOB: 11-26-1960, 59 y.o.   MRN: [id number]          Reason for Appointment: Endocrinology follow-up  Referring physician: Ehinger   History of Present Illness:          Date of diagnosis of type 2 diabetes mellitus: 2004        Background history:   She has been on various oral hyperglycemic agents including metformin, Janumet and Amaryl in the past Apparently her blood sugars were fairly well controlled until 2012 when her A1c was 7.5 and subsequently it has been mostly higher above 8% She has tried other agents including Invokana and Marcelline Deist as well as Bydureon However her A1c has not improved with these changes She apparently had not lost weight with Bydureon and blood sugars were not better  Her baseline A1c was 9.3 in April 2018   Recent history:        Non-insulin hypoglycemic drugs the patient is taking are: Metformin 1000 twice a day, Amaryl 3 mg p.m., Invokana 300 mg daily  INJECTABLE drugs: SOLIQUA 30 units in p.m.  Current management, blood sugar patterns and problems identified:  A1c is much better at 6.6, last 8.1  Lowest A1c previously was 6.8 in 2018   She was advised to start [place] on her last visit in August because of high fasting readings  However about 6 weeks ago she started Taking the Herbalife diet and with this her blood sugars came down significantly  She stopped taking her Evaristo Bury  Also about 10 days ago she reduced her Soliqua by about 20 units because of low normal blood sugars especially overnight  With this new diet she has lost almost 20 pounds  Currently she is mostly eating protein shakes and a generally low-fat low carbohydrate meal in the evening  Also is trying to do more walking than usual  More recently her fasting readings are mostly in the normal range and has not had any low sugars since 10/15  She has not had excessive hunger as before also and she thinks this may be possibly from  taking a special herbal supplement also        Dinner is usually at 6 pm  Side effects from medications have been: None  Compliance with the medical regimen: Fair  Glucose monitoring:  done at least 1  times a day         Glucometer:  One Touch       Blood Glucose readings by review of download   PRE-MEAL Fasting Lunch Dinner  evening Overall  Glucose range:  64-130      74-123   Mean/median:  91 112 122  101    Previous data:   PRE-MEAL Fasting Lunch Dinner Bedtime Overall  Glucose range:  140-177   149, 180    Mean/median:  160     155   POST-MEAL PC Breakfast PC Lunch PC Dinner  Glucose range:    125-162  Mean/median:    142    Self-care:      Typical meal intake: Breakfast is protein shake or egg and toast               Dietician visit, most recent: 2013               Weight history: Previous range 180-281  Wt Readings from Last 3 Encounters:  05/10/20 234 lb (106.1 kg)  03/07/20 251 lb 6.4 oz (114  kg)  01/02/20 250 lb 6.4 oz (113.6 kg)    Glycemic control:    Lab Results  Component Value Date   HGBA1C 6.6 (A) 05/10/2020   HGBA1C 8.1 (A) 01/02/2020   HGBA1C 8.0 (A) 08/02/2019   Lab Results  Component Value Date   MICROALBUR <0.7 05/10/2020   LDLCALC 79 05/10/2020   CREATININE 0.61 05/10/2020   Lab Results  Component Value Date   MICRALBCREAT 1.3 05/10/2020    Lab Results  Component Value Date   FRUCTOSAMINE 255 10/13/2019     Other active problems: See review of systems   Allergies as of 05/10/2020      Reactions   Penicillins Rash      Medication List       Accurate as of May 10, 2020  3:38 PM. If you have any questions, ask your nurse or doctor.        STOP taking these medications   glimepiride 2 MG tablet Commonly known as: AMARYL Stopped by: Reather Littler, MD     TAKE these medications   aspirin EC 81 MG tablet Take 81 mg by mouth daily.   atorvastatin 80 MG tablet Commonly known as: LIPITOR TAKE ONE-HALF  TABLET BY MOUTH EVERY DAY FOR CHOLESTEROL   ezetimibe 10 MG tablet Commonly known as: ZETIA TAKE 1 TABLET BY MOUTH DAILY. TAKE ALONG WITH ATORVASTATIN   Invokana 300 MG Tabs tablet Generic drug: canagliflozin TAKE 1 TABLET (300 MG TOTAL) BY MOUTH DAILY BEFORE BREAKFAST.   L-Lysine 500 MG Caps Take by mouth. Takes one daily   levothyroxine 50 MCG tablet Commonly known as: SYNTHROID TAKE 1 TABLET BY MOUTH EVERY MORNING ON AN EMPTY STOMACH   magnesium gluconate 500 MG tablet Commonly known as: MAGONATE Take 500 mg by mouth daily.   metFORMIN 1000 MG tablet Commonly known as: GLUCOPHAGE TAKE 1 TABLET BY MOUTH 2 TIMES A DAY WITH A MEAL   MULTIPLE VITAMIN PO Take by mouth. Takes one vitamin daily   OneTouch Verio test strip Generic drug: glucose blood USE TO TEST BLOOD SUGAR TWICE DAILY DX CODE E11.65   Contour Next Test test strip Generic drug: glucose blood Use Contour test strips as instructed to check blood sugar twice daily.   Soliqua 100-33 UNT-MCG/ML Sopn Generic drug: Insulin Glargine-Lixisenatide INJECT 10 UNITS UNDER THE SKIN IN AM AND 34 UNITS IN THE AFTERNOON What changed: See the new instructions.   Toujeo SoloStar 300 UNIT/ML Solostar Pen Generic drug: insulin glargine (1 Unit Dial) Inject 20 Units into the skin daily.   Vitamin D3 25 MCG (1000 UT) Caps Take by mouth. Takes one daily in the evening.       Allergies:  Allergies  Allergen Reactions  . Penicillins Rash    Past Medical History:  Diagnosis Date  . Allergy   . Diabetes mellitus without complication (HCC)   . Hyperlipidemia   . Hypertension   . Thyroid disease     No past surgical history on file.  Family History  Problem Relation Age of Onset  . Diabetes Mother   . Diabetes Father   . Heart disease Father     Social History:  reports that she has never smoked. She has never used smokeless tobacco. No history on file for alcohol use and drug use.   Review of  Systems   Lipid history: On statin treatment: Lipitor 80 mg and using half tablet  80 mg Lipitor dose causes muscle aches but also more recently she says  she was having muscle aches with even 40 mg dose Until about a few days ago she was trying to take the 40 mg every other day Recently has been following a strict diet and losing weight  Intolerant to Crestor Zetia was started on her visit in 3/20  Labs as follows:   Lab Results  Component Value Date   CHOL 145 05/10/2020   HDL 44.10 05/10/2020   LDLCALC 79 05/10/2020   TRIG 106.0 05/10/2020   CHOLHDL 3 05/10/2020           Most recent eye exam was 12/2018  Most recent foot exam: 6/20  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 No recurrence of hypercalcemia  She has been concerned about recurrence of hyperparathyroidism  Bone density in 2020 was normal  On 4000 U D3 for supplementation with previously adequate levels  Lab Results  Component Value Date   PTH 12 (L) 02/09/2018   CALCIUM 9.9 05/10/2020     Mild hypothyroidism: She has been on 50 mcg of levothyroxine for several years, not clear if she had baseline hypothyroidism  Has been on a stable dose of levothyroxine  LABS:   Lab Results  Component Value Date   TSH 2.14 05/10/2020     Physical Examination:  BP 124/82   Pulse 82   Wt 234 lb (106.1 kg)   SpO2 94%   BMI 41.45 kg/m     ASSESSMENT:  Diabetes type 2, with morbid obesity  See history of present illness for detailed discussion of current diabetes management, blood sugar patterns and problems identified  Her A1c is much better at 6.6 compared to 8%  Current regimen: Soliqua 30 unit  dose at dinnertime, Invokana 300 mg, Amaryl 3 mg mg once a day and metformin 1 g twice daily  Blood sugars are dramatically better with her going on the Herbalife diet which is likely very low carbohydrate and calorie overall She also has lost about 17 pounds and has been able  to cut back on portions better She has cut back on her Soliqua by 20 units but more recently her fasting readings are not as low  HYPERCHOLESTEROLEMIA: She has required Lipitor and Zetia together and generally taking 40 mg Lipitor without side effects except recently She thinks her muscle aches are less with taking 40 mg Lipitor every other day but need follow-up lipids Recently diet has been much better overall   PLAN:   We will continue 30 units of Soliqua for now Stop Amaryl To consider reducing Soliqua by 5 units if morning sugars are staying below 90. To call if she starts getting high readings after meals also Encouraged her to stay active with walking regularly as tolerated  For her lipids she can try taking 40 mg Lipitor every other day but will confirm her lipids today also  Recheck urine microalbumin today  She can continue her usual vitamins including vitamin D which was previously normal    Flu vaccine given  Patient Instructions  Try stopping Amaryl         Reather Littler 05/10/2020, 3:38 PM   Note: This office note was prepared with Dragon voice recognition system technology. Any transcriptional errors that result from this process are unintentional.  Addendum: All labs normal including LDL 79

## 2020-05-10 NOTE — Patient Instructions (Signed)
Try stopping Amaryl

## 2020-06-04 ENCOUNTER — Telehealth: Payer: Self-pay | Admitting: Endocrinology

## 2020-06-04 NOTE — Telephone Encounter (Signed)
Kara Dawson with [facility] requests to be called at ph# [phone] re: Invokana needs PA - Kara Dawson unable to Fax the request

## 2020-06-05 NOTE — Telephone Encounter (Signed)
PA has been started

## 2020-06-15 ENCOUNTER — Other Ambulatory Visit: Payer: Self-pay | Admitting: Endocrinology

## 2020-06-24 ENCOUNTER — Telehealth: Payer: Self-pay | Admitting: *Deleted

## 2020-06-24 ENCOUNTER — Telehealth: Payer: Self-pay | Admitting: Endocrinology

## 2020-06-24 NOTE — Telephone Encounter (Signed)
Patient husband Homer requests to be called at ph# [phone] re: Patient

## 2020-06-24 NOTE — Telephone Encounter (Signed)
I spoke with his wife to let her know to call her insurance company and find out what they will cover in place of the [facility]

## 2020-06-24 NOTE — Telephone Encounter (Signed)
Patient called stating she was prescribed invokana she has been on for years and she is now out and has been out for a week. Patient states her insurance will no longer cover the medication. Please advise [phone]

## 2020-06-24 NOTE — Telephone Encounter (Signed)
I spoke with patient and advised her to call her insurance company to find out what they will pay for instead of Invokana, she agreed and will call back when she knows.

## 2020-06-25 ENCOUNTER — Other Ambulatory Visit: Payer: Self-pay

## 2020-06-25 MED ORDER — EMPAGLIFLOZIN 25 MG PO TABS: 25.0000 mg | ORAL_TABLET | Freq: Every day | ORAL | 3 refills | Status: DC

## 2020-06-25 NOTE — Telephone Encounter (Signed)
Will route to MD to advise on medication change.  Thanks!

## 2020-06-25 NOTE — Telephone Encounter (Signed)
We can mail her a card, also can download the card on computer from [email or web address] by selecting type II diabetes as a diagnosis

## 2020-06-25 NOTE — Telephone Encounter (Signed)
She can switch from Invokana 300 mg to [name] 25 mg daily

## 2020-06-25 NOTE — Telephone Encounter (Signed)
Noted.  Sent RX to pharmacy

## 2020-06-25 NOTE — Telephone Encounter (Signed)
Patients husband stated that he was able to download a discount card for farxiga directly to his phone but he has to wait to receive the [name] discount by mail and this could take weeks. He is requesting her start the farxiga if ok with Dr Lucianne Muss.

## 2020-06-25 NOTE — Telephone Encounter (Signed)
Patient called insurance and she is able to get discount card for [place]. Patients states she is relaying information as requested.  Call back [phone]

## 2020-06-26 NOTE — Telephone Encounter (Signed)
Patient was able to download card and will pick up Rx today

## 2020-07-25 ENCOUNTER — Other Ambulatory Visit: Payer: Self-pay | Admitting: Endocrinology

## 2020-07-28 ENCOUNTER — Other Ambulatory Visit: Payer: Self-pay | Admitting: Endocrinology

## 2020-08-14 NOTE — Progress Notes (Unsigned)
Patient ID: Kara Dawson, female   DOB: Sep 16, 1960, 60 y.o.   MRN: [id number]          Reason for Appointment: Endocrinology follow-up  Referring physician: Ehinger   History of Present Illness:          Date of diagnosis of type 2 diabetes mellitus: 2004        Background history:   She has been on various oral hyperglycemic agents including metformin, Janumet and Amaryl in the past Apparently her blood sugars were fairly well controlled until 2012 when her A1c was 7.5 and subsequently it has been mostly higher above 8% She has tried other agents including Invokana and Marcelline Deist as well as Bydureon However her A1c has not improved with these changes She apparently had not lost weight with Bydureon and blood sugars were not better  Her baseline A1c was 9.3 in April 2018   Recent history:        Non-insulin hypoglycemic drugs the patient is taking are: Metformin 1000 twice a day, [name] 25 mg daily     INJECTABLE drugs: SOLIQUA 30 units in p.m.  Current management, blood sugar patterns and problems identified:  A1c is up 1% to 7.6 from 6.6    Although she has not materially changed her diet her A1c appears to be higher  She did have relatively higher readings at least in December and beginning of January from inconsistent diet, lack of consistent exercise because of the weather  Also she thinks blood sugars may be higher at least last month from switching from [place] to [place] because of insurance Denial  However she has still lost 19 pounds since her last visit in October  More recently her blood sugars overall are still excellent and averaging 145  May be having mildly increased fasting readings, in the last week in the range = 104-148  Currently taking [name] in the afternoon instead of morning  She is still able to recently control her portions and carbohydrates and eat healthy meals        Dinner is usually at 6 pm  Side effects from medications  have been: None  Compliance with the medical regimen: Fair  Glucose monitoring:  done at least 1  times a day         Glucometer:  One Touch       Blood Glucose readings by review of download   PRE-MEAL Fasting Lunch Dinner Bedtime Overall  Glucose range:      91-225  Mean/median:  132   142  135   POST-MEAL PC Breakfast PC Lunch PC Dinner  Glucose range:     Mean/median:   140  124   Previously:  PRE-MEAL Fasting Lunch Dinner  evening Overall  Glucose range:  64-130      74-123   Mean/median:  91 112 122  101     Self-care:      Typical meal intake: Breakfast is protein shake or egg and toast               Dietician visit, most recent: 2013               Weight history: Previous range 180-281  Wt Readings from Last 3 Encounters:  08/15/20 215 lb 6.4 oz (97.7 kg)  05/10/20 234 lb (106.1 kg)  03/07/20 251 lb 6.4 oz (114 kg)    Glycemic control:    Lab Results  Component Value Date   HGBA1C 7.6 (A) 08/15/2020  HGBA1C 6.6 (A) 05/10/2020   HGBA1C 8.1 (A) 01/02/2020   Lab Results  Component Value Date   MICROALBUR <0.7 05/10/2020   LDLCALC 79 05/10/2020   CREATININE 0.61 05/10/2020   Lab Results  Component Value Date   MICRALBCREAT 1.3 05/10/2020    Lab Results  Component Value Date   FRUCTOSAMINE 255 10/13/2019     Other active problems: See review of systems   Allergies as of 08/15/2020      Reactions   Penicillins Rash      Medication List       Accurate as of August 15, 2020  9:16 AM. If you have any questions, ask your nurse or doctor.        aspirin EC 81 MG tablet Take 81 mg by mouth daily.   atorvastatin 80 MG tablet Commonly known as: LIPITOR TAKE ONE-HALF TABLET BY MOUTH EVERY DAY FOR CHOLESTEROL   empagliflozin 25 MG Tabs tablet Commonly known as: [name] Take 1 tablet (25 mg total) by mouth daily.   ezetimibe 10 MG tablet Commonly known as: ZETIA TAKE 1 TABLET BY MOUTH DAILY. TAKE ALONG WITH ATORVASTATIN    glimepiride 2 MG tablet Commonly known as: AMARYL TAKE 1 & 1/2 TABLETS BY MOUTH IN THE EVENING   L-Lysine 500 MG Caps Take by mouth. Takes one daily   levothyroxine 50 MCG tablet Commonly known as: SYNTHROID TAKE 1 TABLET BY MOUTH EVERY MORNING ON AN EMPTY STOMACH   magnesium gluconate 500 MG tablet Commonly known as: MAGONATE Take 500 mg by mouth daily.   metFORMIN 1000 MG tablet Commonly known as: GLUCOPHAGE TAKE 1 TABLET BY MOUTH 2 TIMES A DAY WITH A MEAL   MULTIPLE VITAMIN PO Take by mouth. Takes one vitamin daily   OneTouch Verio test strip Generic drug: glucose blood USE TO TEST BLOOD SUGAR TWICE DAILY DX CODE E11.65   Contour Next Test test strip Generic drug: glucose blood Use Contour test strips as instructed to check blood sugar twice daily.   Soliqua 100-33 UNT-MCG/ML Sopn Generic drug: Insulin Glargine-Lixisenatide INJECT 10 UNITS UNDER THE SKIN IN AM AND 34 UNITS IN THE AFTERNOON What changed: See the new instructions.   Toujeo SoloStar 300 UNIT/ML Solostar Pen Generic drug: insulin glargine (1 Unit Dial) Inject 20 Units into the skin daily.   Vitamin D3 25 MCG (1000 UT) Caps Take by mouth. Takes one  twice daily in the evening.       Allergies:  Allergies  Allergen Reactions  . Penicillins Rash    Past Medical History:  Diagnosis Date  . Allergy   . Diabetes mellitus without complication (HCC)   . Hyperlipidemia   . Hypertension   . Thyroid disease     No past surgical history on file.  Family History  Problem Relation Age of Onset  . Diabetes Mother   . Diabetes Father   . Heart disease Father     Social History:  reports that she has never smoked. She has never used smokeless tobacco. No history on file for alcohol use and drug use.   Review of Systems   Lipid history: On statin treatment: Lipitor 80 mg and using half tablet every other day now With continuing low-fat diet and weight loss her LDL is still controlled Has  less muscle aches with reducing her atorvastatin and continues to take Zetia  Intolerant to Crestor Zetia was started on her visit in 3/20  Labs as follows:   Lab Results  Component Value  Date   CHOL 145 05/10/2020   HDL 44.10 05/10/2020   LDLCALC 79 05/10/2020   TRIG 106.0 05/10/2020   CHOLHDL 3 05/10/2020           Most recent eye exam was 01/2020, no retinopathy  Most recent foot exam: 09/2019  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 No recurrence of hypercalcemia  Bone density in 2020 was normal  On 4000 U D3 for supplementation with previously adequate levels  Lab Results  Component Value Date   PTH 12 (L) 02/09/2018   CALCIUM 9.9 05/10/2020     Mild hypothyroidism: She has been on 50 mcg of levothyroxine for several years, not clear if she had baseline hypothyroidism  Has been on a stable dose of levothyroxine with normal TSH levels  LABS:   Lab Results  Component Value Date   TSH 2.14 05/10/2020     Physical Examination:  BP 126/82   Pulse 84   Ht 5\' 3"  (1.6 m)   Wt 215 lb 6.4 oz (97.7 kg)   SpO2 96%   BMI 38.16 kg/m   No ankle edema present  ASSESSMENT:  Diabetes type 2, with morbid obesity  See history of present illness for detailed discussion of current diabetes management, blood sugar patterns and problems identified  Her A1c is relatively higher at 7.6  Current regimen: Soliqua 30 unit  dose at dinnertime, [name] 25 mg and metformin 1 g twice daily  Although she has again lost nearly 20 pounds her A1c is higher This is from likely inconsistent diet over the last couple of months Still requiring relatively less Soliqua As discussed above her morning sugars may be trending higher again although postprandial readings are still controlled  HYPERCHOLESTEROLEMIA: She has required Lipitor and Zetia together and last LDL was at target with 40 mg Lipitor every other day  Muscle cramps in toes and feet: These  are likely idiopathic, she is already on magnesium supplements and vitamin D No previous history of electrolyte abnormalities   PLAN:   32 units of Soliqua She will continue [name] but take it in the morning Continue to monitor blood sugars at various times and adjust Soliqua further if morning sugars are consistently over 120 Regular exercise May try tonic water/mustard at bedtime to help with foot cramps at night  She will request her PCP to draw labs including renal function today    There are no Patient Instructions on file for this visit.        08/15/2020, 9:16 AM   Note: This office note was prepared with Dragon voice recognition system technology. Any transcriptional errors that result from this process are unintentional.  Addendum: All labs normal including LDL 79

## 2020-08-15 ENCOUNTER — Other Ambulatory Visit: Payer: Self-pay

## 2020-08-15 ENCOUNTER — Ambulatory Visit: Payer: PRIVATE HEALTH INSURANCE | Admitting: Endocrinology

## 2020-08-15 ENCOUNTER — Encounter: Payer: Self-pay | Admitting: Endocrinology

## 2020-08-15 VITALS — BP 126/82 | HR 84 | Ht 63.0 in | Wt 215.4 lb

## 2020-08-15 DIAGNOSIS — R252 Cramp and spasm: Secondary | ICD-10-CM

## 2020-08-15 DIAGNOSIS — E7800 Pure hypercholesterolemia, unspecified: Secondary | ICD-10-CM

## 2020-08-15 DIAGNOSIS — E1165 Type 2 diabetes mellitus with hyperglycemia: Secondary | ICD-10-CM

## 2020-08-15 DIAGNOSIS — Z794 Long term (current) use of insulin: Secondary | ICD-10-CM

## 2020-08-15 LAB — POCT GLYCOSYLATED HEMOGLOBIN (HGB A1C): Hemoglobin A1C: 7.6 % — AB (ref 4.0–5.6)

## 2020-08-15 NOTE — Patient Instructions (Signed)
32 soliqua

## 2020-09-06 ENCOUNTER — Other Ambulatory Visit: Payer: Self-pay | Admitting: Endocrinology

## 2020-09-19 ENCOUNTER — Other Ambulatory Visit: Payer: Self-pay | Admitting: Endocrinology

## 2020-10-19 ENCOUNTER — Other Ambulatory Visit: Payer: Self-pay | Admitting: Endocrinology

## 2020-11-14 ENCOUNTER — Other Ambulatory Visit: Payer: Self-pay

## 2020-11-14 ENCOUNTER — Ambulatory Visit: Payer: PRIVATE HEALTH INSURANCE | Admitting: Endocrinology

## 2020-11-14 ENCOUNTER — Encounter: Payer: Self-pay | Admitting: Endocrinology

## 2020-11-14 VITALS — BP 110/60 | HR 74 | Ht 63.0 in | Wt 201.4 lb

## 2020-11-14 DIAGNOSIS — E7800 Pure hypercholesterolemia, unspecified: Secondary | ICD-10-CM | POA: Diagnosis not present

## 2020-11-14 DIAGNOSIS — E1165 Type 2 diabetes mellitus with hyperglycemia: Secondary | ICD-10-CM | POA: Diagnosis not present

## 2020-11-14 DIAGNOSIS — Z794 Long term (current) use of insulin: Secondary | ICD-10-CM

## 2020-11-14 DIAGNOSIS — E039 Hypothyroidism, unspecified: Secondary | ICD-10-CM

## 2020-11-14 LAB — COMPREHENSIVE METABOLIC PANEL
ALT: 19 U/L (ref 0–35)
AST: 20 U/L (ref 0–37)
Albumin: 4.4 g/dL (ref 3.5–5.2)
Alkaline Phosphatase: 68 U/L (ref 39–117)
BUN: 17 mg/dL (ref 6–23)
CO2: 30 mEq/L (ref 19–32)
Calcium: 10 mg/dL (ref 8.4–10.5)
Chloride: 103 mEq/L (ref 96–112)
Creatinine, Ser: 0.58 mg/dL (ref 0.40–1.20)
GFR: 98.91 mL/min (ref >60.00)
Glucose, Bld: 90 mg/dL (ref 70–99)
Potassium: 4.5 mEq/L (ref 3.5–5.1)
Sodium: 141 mEq/L (ref 135–145)
Total Bilirubin: 0.6 mg/dL (ref 0.2–1.2)
Total Protein: 7.4 g/dL (ref 6.0–8.3)

## 2020-11-14 LAB — LIPID PANEL
Cholesterol: 129 mg/dL (ref 0–200)
HDL: 47.7 mg/dL (ref >39.00)
LDL Cholesterol: 64 mg/dL (ref 0–99)
NonHDL: 81.49
Total CHOL/HDL Ratio: 3
Triglycerides: 87 mg/dL (ref 0.0–149.0)
VLDL: 17.4 mg/dL (ref 0.0–40.0)

## 2020-11-14 LAB — TSH: TSH: 2.92 u[IU]/mL (ref 0.35–4.50)

## 2020-11-14 LAB — POCT GLYCOSYLATED HEMOGLOBIN (HGB A1C): Hemoglobin A1C: 6.4 % — AB (ref 4.0–5.6)

## 2020-11-14 LAB — T4, FREE: Free T4: 0.99 ng/dL (ref 0.60–1.60)

## 2020-11-14 NOTE — Patient Instructions (Signed)
Take 28 Soliqua

## 2020-11-14 NOTE — Progress Notes (Signed)
Patient ID: Kara Dawson, female   DOB: 07-31-1960, 60 y.o.   MRN: [id number]          Reason for Appointment: Endocrinology follow-up  Referring physician: Ehinger   History of Present Illness:          Date of diagnosis of type 2 diabetes mellitus: 2004        Background history:   She has been on various oral hyperglycemic agents including metformin, Janumet and Amaryl in the past Apparently her blood sugars were fairly well controlled until 2012 when her A1c was 7.5 and subsequently it has been mostly higher above 8% She has tried other agents including Invokana and Marcelline Deist as well as Bydureon However her A1c has not improved with these changes She apparently had not lost weight with Bydureon and blood sugars were not better  Her baseline A1c was 9.3 in April 2018   Recent history:        Non-insulin hypoglycemic drugs the patient is taking are: Metformin 1000 twice a day, [name] 25 mg daily     INJECTABLE drugs: SOLIQUA 30 units in p.m.  Current management, blood sugar patterns and problems identified:  A1c is much better at 6.4 compared to 7.6    Although she was told to take 32 units Soliqua in January she is still taking 30  She thinks she has done better with her diet with eating healthier and also working with [facility] program  She is able to do some walking from more yard work for exercise  She has lost another 14 pounds since her last visit  Her fasting readings are significantly better compared to her last visit and as low as 74, generally lower if she is more active  She has switched to [name] to the morning as directed  Has only occasional readings after meals or at bedtime but they are fairly good        Dinner is usually at 6 pm  Side effects from medications have been: None  Compliance with the medical regimen: Fair  Glucose monitoring:  done at least 1  times a day         Glucometer:  One Touch       Blood Glucose readings by  review of download   PRE-MEAL Fasting Lunch Dinner Bedtime Overall  Glucose range:  73-175      Mean/median:  118    104  115   POST-MEAL PC Breakfast PC Lunch PC Dinner  Glucose range:   145   Mean/median:      Previously:  PRE-MEAL Fasting Lunch Dinner Bedtime Overall  Glucose range:      91-225  Mean/median:  132   142  135   POST-MEAL PC Breakfast PC Lunch PC Dinner  Glucose range:     Mean/median:   140  124    Self-care:      Typical meal intake: Breakfast is protein shake or egg and toast               Dietician visit, most recent: 2013               Weight history: Previous range 180-281  Wt Readings from Last 3 Encounters:  11/14/20 201 lb 6.4 oz (91.4 kg)  08/15/20 215 lb 6.4 oz (97.7 kg)  05/10/20 234 lb (106.1 kg)    Glycemic control:    Lab Results  Component Value Date   HGBA1C 6.4 (A) 11/14/2020   HGBA1C 7.6 (  A) 08/15/2020   HGBA1C 6.6 (A) 05/10/2020   Lab Results  Component Value Date   MICROALBUR <0.7 05/10/2020   LDLCALC 79 05/10/2020   CREATININE 0.61 05/10/2020   Lab Results  Component Value Date   MICRALBCREAT 1.3 05/10/2020    Lab Results  Component Value Date   FRUCTOSAMINE 255 10/13/2019     Other active problems: See review of systems   Allergies as of 11/14/2020      Reactions   Penicillins Rash      Medication List       Accurate as of November 14, 2020  8:38 AM. If you have any questions, ask your nurse or doctor.        aspirin EC 81 MG tablet Take 81 mg by mouth daily.   atorvastatin 80 MG tablet Commonly known as: LIPITOR TAKE ONE-HALF TABLET BY MOUTH EVERY DAY FOR CHOLESTEROL   ezetimibe 10 MG tablet Commonly known as: ZETIA TAKE 1 TABLET BY MOUTH DAILY. TAKE ALONG WITH ATORVASTATIN   glimepiride 2 MG tablet Commonly known as: AMARYL TAKE 1 & 1/2 TABLETS BY MOUTH IN THE EVENING   [name] 25 MG Tabs tablet Generic drug: empagliflozin TAKE 1 TABLET BY MOUTH EVERY DAY   L-Lysine 500 MG  Caps Take by mouth. Takes one daily   levothyroxine 50 MCG tablet Commonly known as: SYNTHROID TAKE 1 TABLET BY MOUTH EVERY MORNING ON AN EMPTY STOMACH   magnesium gluconate 500 MG tablet Commonly known as: MAGONATE Take 500 mg by mouth daily.   metFORMIN 1000 MG tablet Commonly known as: GLUCOPHAGE TAKE 1 TABLET BY MOUTH 2 TIMES A DAY WITH A MEAL   MULTIPLE VITAMIN PO Take by mouth. Takes one vitamin daily   OneTouch Verio test strip Generic drug: glucose blood USE TO TEST BLOOD SUGAR TWICE DAILY DX CODE E11.65   Contour Next Test test strip Generic drug: glucose blood Use Contour test strips as instructed to check blood sugar twice daily.   Soliqua 100-33 UNT-MCG/ML Sopn Generic drug: Insulin Glargine-Lixisenatide INJECT 10 UNITS UNDER THE SKIN IN AM AND 34 UNITS IN THE AFTERNOON What changed: See the new instructions.   Toujeo SoloStar 300 UNIT/ML Solostar Pen Generic drug: insulin glargine (1 Unit Dial) Inject 20 Units into the skin daily.   Vitamin D3 25 MCG (1000 UT) Caps Take by mouth. Takes one  twice daily in the evening.       Allergies:  Allergies  Allergen Reactions  . Penicillins Rash    Past Medical History:  Diagnosis Date  . Allergy   . Diabetes mellitus without complication (HCC)   . Hyperlipidemia   . Hypertension   . Thyroid disease     No past surgical history on file.  Family History  Problem Relation Age of Onset  . Diabetes Mother   . Diabetes Father   . Heart disease Father     Social History:  reports that she has never smoked. She has never used smokeless tobacco. No history on file for alcohol use and drug use.   Review of Systems   Lipid history: On statin treatment: Lipitor 80 mg and using half tablet every other day now With continuing low-fat diet and weight loss her LDL is still controlled Has less muscle aches with reducing her atorvastatin and continues to take Zetia  Intolerant to Crestor Zetia was started  on her visit in 3/20  Labs as follows:   Lab Results  Component Value Date   CHOL 145  05/10/2020   HDL 44.10 05/10/2020   LDLCALC 79 05/10/2020   TRIG 106.0 05/10/2020   CHOLHDL 3 05/10/2020           Most recent eye exam was 01/2020, no retinopathy  Most recent foot exam: 09/2019  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 No recurrence of hypercalcemia  Bone density in 2020 was normal  On 4000 U D3 for supplementation with previously adequate levels  Lab Results  Component Value Date   PTH 12 (L) 02/09/2018   CALCIUM 9.9 05/10/2020     Mild hypothyroidism: She has been on 50 mcg of levothyroxine for several years, not clear if she had baseline hypothyroidism  Has been on a stable dose of levothyroxine with normal TSH levels No complaints of fatigue  LABS:   Lab Results  Component Value Date   TSH 2.14 05/10/2020     Physical Examination:  BP 110/60   Pulse 74   Ht 5\' 3"  (1.6 m)   Wt 201 lb 6.4 oz (91.4 kg)   SpO2 97%   BMI 35.68 kg/m     ASSESSMENT:  Diabetes type 2, with morbid obesity  See history of present illness for detailed discussion of current diabetes management, blood sugar patterns and problems identified  Her A1c is improved at 6.4  Current regimen: Soliqua 30 units at dinnertime, [name] 25 mg and metformin 1 g twice daily  She continues to lose weight and now her A1c has improved also She has been more consistent with her diet and exercise level has increased  Her fasting readings are significantly better and may be low normal at times Does not appear to have high readings after meals but not checking enough  HYPERCHOLESTEROLEMIA: She has been on a high equivalent of 20 mg Lipitor and Zetia and needs follow-up labs   Hypothyroidism: Needs follow-up labs   PLAN:   Go down to 28 units of Soliqua She will continue to reduce by 2 units weekly if blood sugars are still below 100 consistently  She  will continue [name] and metformin She will try to check more readings after meals also She will continue her diet diet and regular walking for exercise  Labs to be drawn today  She will need to hold off on the booster shot for COVID until there is clear evidence of the benefit and alternative vaccines, she can also discuss with PCP  There are no Patient Instructions on file for this visit.        11/14/2020, 8:38 AM   Note: This office note was prepared with Dragon voice recognition system technology. Any transcriptional errors that result from this process are unintentional.  Addendum: All labs normal including LDL 79

## 2020-12-31 ENCOUNTER — Other Ambulatory Visit: Payer: Self-pay | Admitting: Endocrinology

## 2021-02-06 LAB — HM DIABETES EYE EXAM

## 2021-02-11 ENCOUNTER — Other Ambulatory Visit: Payer: Self-pay | Admitting: Endocrinology

## 2021-02-28 ENCOUNTER — Encounter: Payer: Self-pay | Admitting: Endocrinology

## 2021-03-19 NOTE — Progress Notes (Signed)
Patient ID: Kara Dawson, female   DOB: November 15, 1960, 60 y.o.   MRN: [id number]          Reason for Appointment: Endocrinology follow-up  Referring physician: Ehinger   History of Present Illness:          Date of diagnosis of type 2 diabetes mellitus: 2004        Background history:   She has been on various oral hyperglycemic agents including metformin, Janumet and Amaryl in the past Apparently her blood sugars were fairly well controlled until 2012 when her A1c was 7.5 and subsequently it has been mostly higher above 8% She has tried other agents including Invokana and Marcelline Deist as well as Bydureon However her A1c has not improved with these changes She apparently had not lost weight with Bydureon and blood sugars were not better  Her baseline A1c was 9.3 in April 2018   Recent history:        Non-insulin hypoglycemic drugs the patient is taking are: Metformin 1000 twice a day, [name] 25 mg daily     INJECTABLE drugs: SOLIQUA 25 units in p.m.  Current management, blood sugar patterns and problems identified:  A1c is back up to 7.3, previously was better at 6.4 compared to 7.6   She was able to get back on her Soliqua progressively down to 25 units after her last visit when she was taking 30 units  As before she takes this before dinnertime  She said that because of being on vacation and losing some motivation as well as stress she has not been watching her diet consistently  However she still has maintained some weight loss since after her visit  Only occasionally is exercising and not walking as much as before  Most of her monitoring is again in the morning Overall highest blood sugar is 199 but her blood sugar in the last 2 readings are 109 and 121 She is asking about the safety of long-term metformin        Dinner is usually at 6 pm  Side effects from medications have been: None  Compliance with the medical regimen: Fair  Glucose monitoring:  done at least 1   times a day         Glucometer:  Contour     Blood Glucose readings by review of download   PRE-MEAL Fasting Lunch Dinner Bedtime Overall  Glucose range:     109-199  Mean/median: 149  133  145   POST-MEAL PC Breakfast PC Lunch PC Dinner  Glucose range:   168  Mean/median:      PREVIOUSLY:  PRE-MEAL Fasting Lunch Dinner Bedtime Overall  Glucose range:  73-175      Mean/median:  118    104  115   POST-MEAL PC Breakfast PC Lunch PC Dinner  Glucose range:   145   Mean/median:        Self-care:      Typical meal intake: Breakfast is protein shake or egg and toast               Dietician visit, most recent: 2013               Weight history: Previous range 180-281  Wt Readings from Last 3 Encounters:  03/20/21 192 lb 6.4 oz (87.3 kg)  11/14/20 201 lb 6.4 oz (91.4 kg)  08/15/20 215 lb 6.4 oz (97.7 kg)    Glycemic control:    Lab Results  Component Value Date  HGBA1C 7.3 (A) 03/20/2021   HGBA1C 6.4 (A) 11/14/2020   HGBA1C 7.6 (A) 08/15/2020   Lab Results  Component Value Date   MICROALBUR <0.7 05/10/2020   LDLCALC 64 11/14/2020   CREATININE 0.58 11/14/2020   Lab Results  Component Value Date   MICRALBCREAT 1.3 05/10/2020    Lab Results  Component Value Date   FRUCTOSAMINE 255 10/13/2019     Other active problems: See review of systems   Allergies as of 03/20/2021       Reactions   Penicillins Rash        Medication List        Accurate as of March 20, 2021  8:34 AM. If you have any questions, ask your nurse or doctor.          aspirin EC 81 MG tablet Take 81 mg by mouth daily.   atorvastatin 80 MG tablet Commonly known as: LIPITOR TAKE ONE-HALF TABLET BY MOUTH EVERY DAY FOR CHOLESTEROL   ezetimibe 10 MG tablet Commonly known as: ZETIA TAKE 1 TABLET BY MOUTH DAILY. TAKE ALONG WITH ATORVASTATIN   [name] 25 MG Tabs tablet Generic drug: empagliflozin TAKE 1 TABLET BY MOUTH EVERY DAY   L-Lysine 500 MG Caps Take by mouth.  Takes one daily   levothyroxine 50 MCG tablet Commonly known as: SYNTHROID TAKE 1 TABLET BY MOUTH EVERY MORNING ON AN EMPTY STOMACH   magnesium gluconate 500 MG tablet Commonly known as: MAGONATE Take 500 mg by mouth daily.   metFORMIN 1000 MG tablet Commonly known as: GLUCOPHAGE TAKE 1 TABLET BY MOUTH 2 TIMES A DAY WITH A MEAL   MULTIPLE VITAMIN PO Take by mouth. Takes one vitamin daily   OneTouch Verio test strip Generic drug: glucose blood USE TO TEST BLOOD SUGAR TWICE DAILY DX CODE E11.65   Contour Next Test test strip Generic drug: glucose blood Use Contour test strips as instructed to check blood sugar twice daily.   Soliqua 100-33 UNT-MCG/ML Sopn Generic drug: Insulin Glargine-Lixisenatide INJECT 10 UNITS UNDER THE SKIN IN AM AND 34 UNITS IN THE AFTERNOON   Vitamin D3 25 MCG (1000 UT) Caps Take by mouth. Takes one  twice daily in the evening.        Allergies:  Allergies  Allergen Reactions   Penicillins Rash    Past Medical History:  Diagnosis Date   Allergy    Diabetes mellitus without complication (HCC)    Hyperlipidemia    Hypertension    Thyroid disease     No past surgical history on file.  Family History  Problem Relation Age of Onset   Diabetes Mother    Diabetes Father    Heart disease Father     Social History:  reports that she has never smoked. She has never used smokeless tobacco. No history on file for alcohol use and drug use.   Review of Systems   Lipid history: On statin treatment: Lipitor 80 mg and using half tablet every other day now  Has less muscle aches with reducing her atorvastatin and continues to take Zetia Intolerant to Crestor Zetia was started on her visit in 3/20  She has had improved control with continuing low-fat diet and weight loss   Labs as follows:   Lab Results  Component Value Date   CHOL 129 11/14/2020   CHOL 145 05/10/2020   CHOL 150 01/02/2020   Lab Results  Component Value Date   HDL  47.70 11/14/2020   HDL 44.10 05/10/2020  HDL 43.10 01/02/2020   Lab Results  Component Value Date   LDLCALC 64 11/14/2020   LDLCALC 79 05/10/2020   LDLCALC 71 01/02/2020   Lab Results  Component Value Date   TRIG 87.0 11/14/2020   TRIG 106.0 05/10/2020   TRIG 178.0 (H) 01/02/2020   Lab Results  Component Value Date   CHOLHDL 3 11/14/2020   CHOLHDL 3 05/10/2020   CHOLHDL 3 01/02/2020   No results found for: LDLDIRECT          Most recent eye exam was 7/22, no retinopathy  Most recent foot exam: 09/2019  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 No recurrence of hypercalcemia  Bone density in 2020 was normal  On 4000 U D3 for supplementation with previously adequate levels  Lab Results  Component Value Date   PTH 12 (L) 02/09/2018   CALCIUM 10.0 11/14/2020     Mild hypothyroidism: She has been on 50 mcg of levothyroxine for several years, not clear what her baseline TSH was  Has been on a stable dose of levothyroxine with normal TSH levels No complaints of fatigue  LABS:   Lab Results  Component Value Date   TSH 2.92 11/14/2020     Physical Examination:  BP 118/78   Pulse 76   Ht 5\' 3"  (1.6 m)   Wt 192 lb 6.4 oz (87.3 kg)   SpO2 99%   BMI 34.08 kg/m     ASSESSMENT:  Diabetes type 2, with morbid obesity  See history of present illness for detailed discussion of current diabetes management, blood sugar patterns and problems identified  Her A1c is up to 7.3, previously was at 6.4  Current regimen: Soliqua 25 units at dinnertime, [name] 25 mg and metformin 1 g twice daily  She has had some weight loss since her visit in April but her A1c is going up  This is from inadequate diet, probably less exercise and occasional intra-articular steroid injections  Her fasting readings are generally the highest but she has not done enough readings after meals  HYPERCHOLESTEROLEMIA: She has had excellent control including on  the last visit when she had improved her diet   Hypothyroidism: Needs follow-up labs   PLAN:   No change in Thomas unless her morning sugars are consistently over 130 Start checking blood sugars couple hours after meals regularly  She will continue to reduce by 2 units weekly if blood sugars are still below 100 consistently  She will continue [name] and metformin Reassured her about the safety of metformin long-term She will try to check more readings after meals also She will continue to improve her diet and start doing more regular walking for exercise  Labs to be drawn today  She will need to get the new booster shot for COVID when available this month    There are no Patient Instructions on file for this visit.        [place] 03/20/2021, 8:34 AM   Note: This office note was prepared with Dragon voice recognition system technology. Any transcriptional errors that result from this process are unintentional.  Addendum: All labs normal including LDL 79

## 2021-03-20 ENCOUNTER — Ambulatory Visit: Payer: PRIVATE HEALTH INSURANCE | Admitting: Endocrinology

## 2021-03-20 ENCOUNTER — Encounter: Payer: Self-pay | Admitting: Endocrinology

## 2021-03-20 ENCOUNTER — Other Ambulatory Visit: Payer: Self-pay

## 2021-03-20 VITALS — BP 118/78 | HR 76 | Ht 63.0 in | Wt 192.4 lb

## 2021-03-20 DIAGNOSIS — E7800 Pure hypercholesterolemia, unspecified: Secondary | ICD-10-CM | POA: Diagnosis not present

## 2021-03-20 DIAGNOSIS — E1165 Type 2 diabetes mellitus with hyperglycemia: Secondary | ICD-10-CM | POA: Diagnosis not present

## 2021-03-20 DIAGNOSIS — Z794 Long term (current) use of insulin: Secondary | ICD-10-CM

## 2021-03-20 DIAGNOSIS — E039 Hypothyroidism, unspecified: Secondary | ICD-10-CM

## 2021-03-20 LAB — LIPID PANEL
Cholesterol: 151 mg/dL (ref 0–200)
HDL: 52.3 mg/dL (ref >39.00)
LDL Cholesterol: 77 mg/dL (ref 0–99)
NonHDL: 98.83
Total CHOL/HDL Ratio: 3
Triglycerides: 109 mg/dL (ref 0.0–149.0)
VLDL: 21.8 mg/dL (ref 0.0–40.0)

## 2021-03-20 LAB — COMPREHENSIVE METABOLIC PANEL
ALT: 24 U/L (ref 0–35)
AST: 19 U/L (ref 0–37)
Albumin: 4.3 g/dL (ref 3.5–5.2)
Alkaline Phosphatase: 56 U/L (ref 39–117)
BUN: 20 mg/dL (ref 6–23)
CO2: 30 mEq/L (ref 19–32)
Calcium: 9.6 mg/dL (ref 8.4–10.5)
Chloride: 103 mEq/L (ref 96–112)
Creatinine, Ser: 0.52 mg/dL (ref 0.40–1.20)
GFR: 101.3 mL/min (ref >60.00)
Glucose, Bld: 118 mg/dL — ABNORMAL HIGH (ref 70–99)
Potassium: 4.3 mEq/L (ref 3.5–5.1)
Sodium: 141 mEq/L (ref 135–145)
Total Bilirubin: 0.6 mg/dL (ref 0.2–1.2)
Total Protein: 7.2 g/dL (ref 6.0–8.3)

## 2021-03-20 LAB — TSH: TSH: 2.88 u[IU]/mL (ref 0.35–5.50)

## 2021-03-20 LAB — POCT GLYCOSYLATED HEMOGLOBIN (HGB A1C): Hemoglobin A1C: 7.3 % — AB (ref 4.0–5.6)

## 2021-03-20 LAB — MICROALBUMIN / CREATININE URINE RATIO
Creatinine,U: 44.1 mg/dL
Microalb Creat Ratio: 1.6 mg/g (ref 0.0–30.0)
Microalb, Ur: <0.7 mg/dL (ref 0.0–1.9)

## 2021-03-20 NOTE — Patient Instructions (Signed)
Check blood sugars on waking up days a week  Also check blood sugars about 2 hours after meals and do this after different meals by rotation  Recommended blood sugar levels on waking up are 90-130 and about 2 hours after meal is 130-160  Please bring your blood sugar monitor to each visit, thank you   

## 2021-03-30 NOTE — H&P (Signed)
Preoperative History & Physical Exam  Surgeon: Philipp Ovens, MD  Diagnosis: Right dequervains syndrome, right wrist ganglion excision  Planned Procedure: Procedure(s) (LRB): Right wrist dequervains release, right wrist ganglion excision (Right)  History of Present Illness:   Patient is a 60 y.o. female with symptoms consistent with  Right dequervains syndrome, right wrist ganglion excision who presents for surgical intervention. The risks, benefits and alternatives of surgical intervention were discussed and informed consent was obtained prior to surgery.  Past Medical History:  Past Medical History:  Diagnosis Date   Allergy    Diabetes mellitus without complication (HCC)    Hyperlipidemia    Hypertension    Thyroid disease     Past Surgical History: No past surgical history on file.  Medications:  Prior to Admission medications   Medication Sig Start Date End Date Taking? Authorizing Provider  aspirin EC 81 MG tablet Take 81 mg by mouth daily.    [provider]  atorvastatin (LIPITOR) 80 MG tablet TAKE ONE-HALF TABLET BY MOUTH EVERY DAY FOR CHOLESTEROL 09/19/20   Reather Littler, MD  Cholecalciferol (VITAMIN D3) 1000 units CAPS Take by mouth. Takes one  twice daily in the evening.    [provider]  ezetimibe (ZETIA) 10 MG tablet TAKE 1 TABLET BY MOUTH DAILY. TAKE ALONG WITH ATORVASTATIN 07/25/20   Reather Littler, MD  glucose blood (CONTOUR NEXT TEST) test strip Use Contour test strips as instructed to check blood sugar twice daily. 08/07/19   Reather Littler, MD  glucose blood [facility] VERIO) test strip USE TO TEST BLOOD SUGAR TWICE DAILY DX CODE E11.65 08/03/19   Reather Littler, MD  [name] 25 MG TABS tablet TAKE 1 TABLET BY MOUTH EVERY DAY 02/14/21   Reather Littler, MD  L-Lysine 500 MG CAPS Take by mouth. Takes one daily    [provider]  levothyroxine (SYNTHROID) 50 MCG tablet TAKE 1 TABLET BY MOUTH EVERY MORNING ON AN EMPTY STOMACH 09/19/20   Reather Littler, MD   magnesium gluconate (MAGONATE) 500 MG tablet Take 500 mg by mouth daily.    [provider]  metFORMIN (GLUCOPHAGE) 1000 MG tablet TAKE 1 TABLET BY MOUTH 2 TIMES A DAY WITH A MEAL 12/31/20   Reather Littler, MD  MULTIPLE VITAMIN PO Take by mouth. Takes one vitamin daily    [provider]  SOLIQUA 100-33 UNT-MCG/ML SOPN INJECT 10 UNITS UNDER THE SKIN IN AM AND 34 UNITS IN THE AFTERNOON 02/14/21   Reather Littler, MD    Allergies:  Penicillins  Review of Systems: Negative except per HPI.  Physical Exam: Alert and oriented, NAD Head and neck: no masses, normal alignment CV: pulse intact Pulm: no increased work of breathing, respirations even and unlabored Abdomen: non-distended Extremities: extremities warm and well perfused  LABS: Recent Results (from the past 2160 hour(s))  HM DIABETES EYE EXAM     Status: Abnormal   Collection Time: 02/06/21 12:00 AM  Result Value Ref Range   HM Diabetic Eye Exam Retinopathy (A) No Retinopathy  POCT HgB A1C     Status: Abnormal   Collection Time: 03/20/21  8:31 AM  Result Value Ref Range   Hemoglobin A1C 7.3 (A) 4.0 - 5.6 %   HbA1c POC (<> result, manual entry)     HbA1c, POC (prediabetic range)     HbA1c, POC (controlled diabetic range)    TSH     Status: None   Collection Time: 03/20/21  8:48 AM  Result Value Ref Range  TSH 2.88 0.35 - 5.50 uIU/mL  Lipid panel     Status: None   Collection Time: 03/20/21  8:48 AM  Result Value Ref Range   Cholesterol 151 0 - 200 mg/dL    Comment: ATP III Classification       Desirable:  < 200 mg/dL               Borderline High:  200 - 239 mg/dL          High:  > = 009 mg/dL   Triglycerides 381.8 0.0 - 149.0 mg/dL    Comment: Normal:  <299 mg/dLBorderline High:  150 - 199 mg/dL   HDL 37.16 >96.78 mg/dL   VLDL 93.8 0.0 - 10.1 mg/dL   LDL Cholesterol 77 0 - 99 mg/dL   Total CHOL/HDL Ratio 3     Comment:                Men          Women1/2 Average Risk     3.4          3.3Average Risk           5.0          4.42X Average Risk          9.6          7.13X Average Risk          15.0          11.0                       NonHDL 98.83     Comment: NOTE:  Non-HDL goal should be 30 mg/dL higher than patient's LDL goal (i.e. LDL goal of < 70 mg/dL, would have non-HDL goal of < 100 mg/dL)  Microalbumin / creatinine urine ratio     Status: None   Collection Time: 03/20/21  8:48 AM  Result Value Ref Range   Microalb, Ur <0.7 0.0 - 1.9 mg/dL   Creatinine,U 75.1 mg/dL   Microalb Creat Ratio 1.6 0.0 - 30.0 mg/g  Comprehensive metabolic panel     Status: Abnormal   Collection Time: 03/20/21  8:48 AM  Result Value Ref Range   Sodium 141 135 - 145 mEq/L   Potassium 4.3 3.5 - 5.1 mEq/L   Chloride 103 96 - 112 mEq/L   CO2 30 19 - 32 mEq/L   Glucose, Bld 118 (H) 70 - 99 mg/dL   BUN 20 6 - 23 mg/dL   Creatinine, Ser 0.25 0.40 - 1.20 mg/dL   Total Bilirubin 0.6 0.2 - 1.2 mg/dL   Alkaline Phosphatase 56 39 - 117 U/L   AST 19 0 - 37 U/L   ALT 24 0 - 35 U/L   Total Protein 7.2 6.0 - 8.3 g/dL   Albumin 4.3 3.5 - 5.2 g/dL   GFR 852.77 >82.42 mL/min    Comment: Calculated using the CKD-EPI Creatinine Equation (2021)   Calcium 9.6 8.4 - 10.5 mg/dL     Complete History and Physical exam available in the office notes  [name]

## 2021-03-31 ENCOUNTER — Encounter (HOSPITAL_BASED_OUTPATIENT_CLINIC_OR_DEPARTMENT_OTHER): Payer: Self-pay | Admitting: Orthopedic Surgery

## 2021-03-31 ENCOUNTER — Other Ambulatory Visit: Payer: Self-pay

## 2021-03-31 NOTE — Progress Notes (Signed)
Spoke w/ via phone for pre-op interview---pt  Lab needs dos----I-STAT, EKG               Lab results------n/a COVID test -----patient states asymptomatic no test needed Arrive at -------0930 04/03/2021 NPO after MN NO Solid Food.  Clear liquids from MN until---0830 Med rec completed Medications to take morning of surgery -----n/a  Diabetic medication ----stopped all diabetic meds morning of surgery- Patient instructed no nail polish to be worn day of surgery Patient instructed to bring photo id and insurance card day of surgery Patient aware to have Driver (ride ) / daughter Loistine Simas caregiver    for 24 hours after surgery  Patient Special Instructions -----Pt stopped aspirin per dr. On 03/29/2021 Pre-Op special Istructions -----n/a Patient verbalized understanding of instructions that were given at this phone interview. Patient denies shortness of breath, chest pain, fever, cough at this phone interview.

## 2021-04-03 ENCOUNTER — Other Ambulatory Visit: Payer: Self-pay

## 2021-04-03 ENCOUNTER — Ambulatory Visit (HOSPITAL_BASED_OUTPATIENT_CLINIC_OR_DEPARTMENT_OTHER)
Admission: RE | Admit: 2021-04-03 | Discharge: 2021-04-03 | Disposition: A | Discharge status: Home / Self Care | Payer: PRIVATE HEALTH INSURANCE | Attending: Orthopedic Surgery | Admitting: Orthopedic Surgery

## 2021-04-03 ENCOUNTER — Ambulatory Visit (HOSPITAL_BASED_OUTPATIENT_CLINIC_OR_DEPARTMENT_OTHER): Payer: PRIVATE HEALTH INSURANCE | Admitting: Anesthesiology

## 2021-04-03 ENCOUNTER — Encounter (HOSPITAL_BASED_OUTPATIENT_CLINIC_OR_DEPARTMENT_OTHER): Payer: Self-pay | Admitting: Orthopedic Surgery

## 2021-04-03 ENCOUNTER — Encounter (HOSPITAL_BASED_OUTPATIENT_CLINIC_OR_DEPARTMENT_OTHER)
Admission: RE | Disposition: A | Discharge status: Home / Self Care | Payer: Self-pay | Source: Home / Self Care | Attending: Orthopedic Surgery

## 2021-04-03 DIAGNOSIS — Z7984 Long term (current) use of oral hypoglycemic drugs: Secondary | ICD-10-CM | POA: Diagnosis not present

## 2021-04-03 DIAGNOSIS — Z880 Allergy status to penicillin: Secondary | ICD-10-CM | POA: Diagnosis not present

## 2021-04-03 DIAGNOSIS — Z794 Long term (current) use of insulin: Secondary | ICD-10-CM | POA: Insufficient documentation

## 2021-04-03 DIAGNOSIS — M654 Radial styloid tenosynovitis [de Quervain]: Secondary | ICD-10-CM | POA: Insufficient documentation

## 2021-04-03 DIAGNOSIS — Z7982 Long term (current) use of aspirin: Secondary | ICD-10-CM | POA: Insufficient documentation

## 2021-04-03 DIAGNOSIS — Z79899 Other long term (current) drug therapy: Secondary | ICD-10-CM | POA: Insufficient documentation

## 2021-04-03 DIAGNOSIS — M67431 Ganglion, right wrist: Secondary | ICD-10-CM | POA: Insufficient documentation

## 2021-04-03 HISTORY — PX: GANGLION CYST EXCISION: SHX1691

## 2021-04-03 HISTORY — DX: Hypothyroidism, unspecified: E03.9

## 2021-04-03 HISTORY — DX: Presence of spectacles and contact lenses: Z97.3

## 2021-04-03 LAB — POCT I-STAT, CHEM 8
BUN: 18 mg/dL (ref 6–20)
Calcium, Ion: 1.26 mmol/L (ref 1.15–1.40)
Chloride: 104 mmol/L (ref 98–111)
Creatinine, Ser: 0.4 mg/dL — ABNORMAL LOW (ref 0.44–1.00)
Glucose, Bld: 164 mg/dL — ABNORMAL HIGH (ref 70–99)
HCT: 43 % (ref 36.0–46.0)
Hemoglobin: 14.6 g/dL (ref 12.0–15.0)
Potassium: 4.2 mmol/L (ref 3.5–5.1)
Sodium: 143 mmol/L (ref 135–145)
TCO2: 27 mmol/L (ref 22–32)

## 2021-04-03 LAB — GLUCOSE, CAPILLARY: Glucose-Capillary: 141 mg/dL — ABNORMAL HIGH (ref 70–99)

## 2021-04-03 SURGERY — EXCISION, GANGLION CYST, WRIST
Anesthesia: Monitor Anesthesia Care | Site: Wrist | Laterality: Right

## 2021-04-03 MED ORDER — LACTATED RINGERS IV SOLN: INTRAVENOUS | Status: DC

## 2021-04-03 MED ORDER — LIDOCAINE HCL (PF) 1 % IJ SOLN
INTRAMUSCULAR | Status: DC | PRN
  Administered 2021-04-03: 5 mL

## 2021-04-03 MED ORDER — CEFAZOLIN SODIUM-DEXTROSE 2-4 GM/100ML-% IV SOLN
2.0000 g | INTRAVENOUS | Status: AC
  Administered 2021-04-03: 2 g via INTRAVENOUS

## 2021-04-03 MED ORDER — LIDOCAINE HCL (PF) 2 % IJ SOLN
INTRAMUSCULAR | Status: AC
  Filled 2021-04-03: qty 5

## 2021-04-03 MED ORDER — CEFAZOLIN SODIUM-DEXTROSE 2-4 GM/100ML-% IV SOLN
INTRAVENOUS | Status: AC
  Filled 2021-04-03: qty 100

## 2021-04-03 MED ORDER — PROPOFOL 10 MG/ML IV BOLUS
INTRAVENOUS | Status: AC
  Filled 2021-04-03: qty 20

## 2021-04-03 MED ORDER — PROPOFOL 10 MG/ML IV BOLUS
INTRAVENOUS | Status: DC | PRN
Start: 2021-04-03 — End: 2021-04-03
  Administered 2021-04-03: 50 mg via INTRAVENOUS

## 2021-04-03 MED ORDER — BUPIVACAINE HCL (PF) 0.5 % IJ SOLN
INTRAMUSCULAR | Status: DC | PRN
  Administered 2021-04-03: 5 mL

## 2021-04-03 MED ORDER — PROPOFOL 500 MG/50ML IV EMUL
INTRAVENOUS | Status: DC | PRN
  Administered 2021-04-03: 200 ug/kg/min via INTRAVENOUS

## 2021-04-03 MED ORDER — LIDOCAINE 2% (20 MG/ML) 5 ML SYRINGE
INTRAMUSCULAR | Status: DC | PRN
  Administered 2021-04-03: 30 mg via INTRAVENOUS

## 2021-04-03 SURGICAL SUPPLY — 28 items
ADH SKN CLS APL DERMABOND .7 (GAUZE/BANDAGES/DRESSINGS) ×1
BLADE SURG 15 STRL LF DISP TIS (BLADE) ×1 IMPLANT
BLADE SURG 15 STRL SS (BLADE) ×2
BNDG CMPR 9X4 STRL LF SNTH (GAUZE/BANDAGES/DRESSINGS) ×1
BNDG ELASTIC 4X5.8 VLCR STR LF (GAUZE/BANDAGES/DRESSINGS) ×1 IMPLANT
BNDG ESMARK 4X9 LF (GAUZE/BANDAGES/DRESSINGS) ×2 IMPLANT
COVER BACK TABLE 60X90IN (DRAPES) ×2 IMPLANT
DERMABOND ADVANCED (GAUZE/BANDAGES/DRESSINGS) ×1
DERMABOND ADVANCED .7 DNX12 (GAUZE/BANDAGES/DRESSINGS) IMPLANT
DRAPE EXTREMITY T 121X128X90 (DISPOSABLE) ×2 IMPLANT
GAUZE 4X4 16PLY ~~LOC~~+RFID DBL (SPONGE) ×2 IMPLANT
GAUZE SPONGE 4X4 12PLY STRL (GAUZE/BANDAGES/DRESSINGS) ×2 IMPLANT
GLOVE SURG ENC MOIS LTX SZ7.5 (GLOVE) ×2 IMPLANT
GOWN STRL REUS W/ TWL LRG LVL3 (GOWN DISPOSABLE) ×1 IMPLANT
GOWN STRL REUS W/TWL LRG LVL3 (GOWN DISPOSABLE) ×4
HIBICLENS CHG 4% 4OZ (MISCELLANEOUS) ×2 IMPLANT
KIT TURNOVER CYSTO (KITS) ×2 IMPLANT
NDL HYPO 25X1 1.5 SAFETY (NEEDLE) ×1 IMPLANT
NEEDLE HYPO 25X1 1.5 SAFETY (NEEDLE) ×2 IMPLANT
PACK BASIN DAY SURGERY FS (CUSTOM PROCEDURE TRAY) ×2 IMPLANT
PADDING CAST ABS 4INX4YD NS (CAST SUPPLIES) ×1
PADDING CAST ABS COTTON 4X4 ST (CAST SUPPLIES) IMPLANT
STRIP CLOSURE SKIN 1/2X4 (GAUZE/BANDAGES/DRESSINGS) ×1 IMPLANT
SUT MNCRL AB 4-0 PS2 18 (SUTURE) ×1 IMPLANT
SYR BULB EAR ULCER 3OZ GRN STR (SYRINGE) ×2 IMPLANT
SYR CONTROL 10ML LL (SYRINGE) ×1 IMPLANT
TOWEL OR 17X26 10 PK STRL BLUE (TOWEL DISPOSABLE) ×2 IMPLANT
UNDERPAD 30X36 HEAVY ABSORB (UNDERPADS AND DIAPERS) ×2 IMPLANT

## 2021-04-03 NOTE — Anesthesia Postprocedure Evaluation (Signed)
Anesthesia Post Note  Patient: Kara Dawson  Procedure(s) Performed: Right wrist dequervains release, right wrist ganglion excision (Right: Wrist)     Patient location during evaluation: PACU Anesthesia Type: MAC Level of consciousness: awake and alert Pain management: pain level controlled Vital Signs Assessment: post-procedure vital signs reviewed and stable Respiratory status: spontaneous breathing Cardiovascular status: stable Anesthetic complications: no   No notable events documented.  Last Vitals:  Vitals:   04/03/21 1155 04/03/21 1233  BP: 135/75 134/62  Pulse: 79 68  Resp: 20 15  Temp: 36.7 C 36.7 C  SpO2: 100% 99%    Last Pain:  Vitals:   04/03/21 1233  TempSrc:   PainSc: 0-No pain                 Nolon Nations

## 2021-04-03 NOTE — Discharge Instructions (Addendum)
Orthopaedic Hand Surgery Discharge Instructions  WEIGHT BEARING STATUS: Non weight bearing on operative extremity  INCISION CARE: Keep dressing over your incision clean and dry until 5 days after surgery. You may shower by placing a waterproof covering over your dressing. Once dressing is removed, you may allow water to run over the incision and then place Band-Aids over incision. Do not scrub your incision or apply creams/lotions. Do not submerge your incision or swim for 3 weeks after surgery. Contact your surgeon or primary care doctor if you develop redness or drainage from your incision.   PAIN CONTROL: First line medications for post operative pain control are Tylenol (acetaminophen) and Motrin (ibuprofen) if you are able to take these medications. If you have been prescribed a medication these can be taken as breakthrough pain medications. Please note that some narcotic pain medication have acetaminophen added and you should never consume more than 4,000mg  of acetaminophen in 24 hour period. Also please note that if you are given Toradol (ketoralac) you should not take similar medications simultaneously such as ibuprofen.   ICE/ELEVATION: Ice and elevate your injured extremity as needed. Avoid direct contact of ice with skin.  HOME MEDICATIONS: No changes have been made to your home medications.  FOLLOW UP: You will be called after surgery with an appointment date and time, however if you have not received a phone call within 3 days please call during regular office hours at [phone] to schedule a post operative appointment.  Please Seek Medical Attention if: Call MD for: pain or pressure in chest, jaw, arm, back, neck  Call MD for: temperature greater than 101 F for more than 24 hours  Call MD for: difficulty breathing Call MD for: Incision redness, bleeding, drainage  Call MD for: palpitations or feeling that the heart is racing  Call MD for: increased swelling in arm, leg, ankle, or  abdomen  Call MD for: lightheadedness, dizziness, fainting Go to ED or call 911 if: chest pain does not go away after 3 nitroglycerin doses taken 5 min apart  Go to ED or call 911 for: any uncontrolled bleeding  Go to ED or call 911 if: unable to reach physician  Discharge Meds: Sent through [facility] [facility] Hydrocodone / acetaminophen 5mg /325mg  Q6h #5 tabs PRN pain    , MD Orthopaedic Hand Surgery   Post Anesthesia Home Care Instructions  Activity: Get plenty of rest for the remainder of the day. A responsible individual must stay with you for 24 hours following the procedure.  For the next 24 hours, DO NOT: -Drive a car -Philipp Ovens -Drink alcoholic beverages -Take any medication unless instructed by your physician -Make any legal decisions or sign important papers.  Meals: Start with liquid foods such as gelatin or soup. Progress to regular foods as tolerated. Avoid greasy, spicy, heavy foods. If nausea and/or vomiting occur, drink only clear liquids until the nausea and/or vomiting subsides. Call your physician if vomiting continues.  Special Instructions/Symptoms: Your throat may feel dry or sore from the anesthesia or the breathing tube placed in your throat during surgery. If this causes discomfort, gargle with warm salt water. The discomfort should disappear within 24 hours.

## 2021-04-03 NOTE — Op Note (Signed)
OPERATIVE NOTE  DATE OF PROCEDURE: 04/03/2021  SURGEONS:  Primary: Orene Desanctis, MD  PREOPERATIVE DIAGNOSIS: Right dequervains syndrome, right wrist ganglion excision  POSTOPERATIVE DIAGNOSIS: Same  NAME OF PROCEDURE:   Right wrist dequervains release 2.   Right wrist ganglion cyst excision  ANESTHESIA: Monitor Anesthesia Care + Local  SKIN PREPARATION: Hibiclens  ESTIMATED BLOOD LOSS: Minimal  IMPLANTS: none  INDICATIONS:  Kara Dawson is a 60 y.o. female who has the above preoperative diagnosis. The patient has decided to proceed with surgical intervention.  Risks, benefits and alternatives of operative management were discussed including, but not limited to, risks of anesthesia complications, infection, pain, persistent symptoms, stiffness, need for future surgery.  The patient understands, agrees and elects to proceed with surgery.    DESCRIPTION OF PROCEDURE: The patient was met in the pre-operative area and their identity was verified.  The operative location and laterality was also verified and marked.  The patient was brought to the OR and was placed supine on the table.  After repeat patient identification with the operative team anesthesia was provided and the patient was prepped and draped in the usual sterile fashion.  A final timeout was performed verifying the correction patient, procedure, location and laterality.  The right upper extremity was elevated and was exsanguinated with an Esmarch and tourniquet inflated to 250 mmHg.  A longitudinal incision was made over the first dorsal compartment.  Skin and subcutaneous tissues were divided followed by identification of the radial sensory nerve which appeared inflamed, edematous and larger in caliber compared to normal.  This was then gently retracted  and the sheath over the first dorsal compartment was identified.  This was released.  The extensor pollicis brevis was identified dorsally and distally there was no separate stab  sheath.  The release was performed along the proximal and distal at the dorsal aspect of the first dorsal compartment.  The wrist was brought through range of motion and there was no subluxation of the tendons.  The wound was thoroughly irrigated.  The attention was then turned to excision of the right wrist ganglion cyst excision.  There was a retinacular cyst present  at the distal aspect of the first dorsal compartment.  This was identified and excised taking care to protect the extensor tendons.  This was a fluid-filled sac consistent with ganglion cyst.  The wound was then again irrigated with normal saline.  Absorbable closure with 4 oh-0 Monocryl deep dermal stitches followed by Dermabond and Steri-Strips was placed.  A sterile soft dressing was applied to the wrist.  The tourniquet was deflated and the fingers were pink and warm and well-perfused.  The patient tolerated the procedure well and was brought to the postanesthesia care unit in stable condition.  The noted case all counts were correct x2.   Kara Holmes, MD

## 2021-04-03 NOTE — Transfer of Care (Signed)
Immediate Anesthesia Transfer of Care Note  Patient: Kara Dawson  Procedure(s) Performed: Procedure(s) (LRB): Right wrist dequervains release, right wrist ganglion excision (Right)  Patient Location: PACU  Anesthesia Type: MAC  Level of Consciousness: awake, alert , oriented and patient cooperative  Airway & Oxygen Therapy: Patient Spontanous Breathing on room air Post-op Assessment: Report given to Phase 2 PACU RN and Post -op Vital signs reviewed and stable  Post vital signs: Reviewed and stable  Complications: No apparent anesthesia complications Last Vitals:  Vitals Value Taken Time  BP    Temp    Pulse    Resp    SpO2      Last Pain:  Vitals:   04/03/21 0937  TempSrc: Oral         Complications: No notable events documented.

## 2021-04-03 NOTE — Anesthesia Preprocedure Evaluation (Addendum)
Anesthesia Evaluation  Patient identified by MRN, date of birth, ID band Patient awake    Reviewed: Allergy & Precautions, NPO status , Patient's Chart, lab work & pertinent test results  Airway Mallampati: II  TM Distance: >3 FB Neck ROM: Full    Dental  (+) Dental Advisory Given, Teeth Intact,    Pulmonary neg pulmonary ROS,    Pulmonary exam normal breath sounds clear to auscultation       Cardiovascular negative cardio ROS Normal cardiovascular exam Rhythm:Regular Rate:Normal     Neuro/Psych negative neurological ROS     GI/Hepatic negative GI ROS, Neg liver ROS,   Endo/Other  diabetesHypothyroidism   Renal/GU negative Renal ROS     Musculoskeletal negative musculoskeletal ROS (+)   Abdominal (+) + obese,   Peds  Hematology negative hematology ROS (+)   Anesthesia Other Findings   Reproductive/Obstetrics                            Anesthesia Physical Anesthesia Plan  ASA: 2  Anesthesia Plan: MAC   Post-op Pain Management:    Induction: Intravenous  PONV Risk Score and Plan: 2 and Treatment may vary due to age or medical condition, Ondansetron, Propofol infusion and Midazolam  Airway Management Planned: Natural Airway  Additional Equipment: None  Intra-op Plan:   Post-operative Plan:   Informed Consent: I have reviewed the patients History and Physical, chart, labs and discussed the procedure including the risks, benefits and alternatives for the proposed anesthesia with the patient or authorized representative who has indicated his/her understanding and acceptance.     Dental advisory given  Plan Discussed with: CRNA  Anesthesia Plan Comments:        Anesthesia Quick Evaluation

## 2021-04-03 NOTE — Interval H&P Note (Signed)
History and Physical Interval Note:  04/03/2021 10:48 AM  Kara Dawson  has presented today for surgery, with the diagnosis of Right dequervains syndrome, right wrist ganglion excision.  The various methods of treatment have been discussed with the patient and family. After consideration of risks, benefits and other options for treatment, the patient has consented to  Procedure(s) with comments: Right wrist dequervains release, right wrist ganglion excision (Right) - with local anesthesia as a surgical intervention.  The patient's history has been reviewed, patient examined, no change in status, stable for surgery.  I have reviewed the patient's chart and labs.  Questions were answered to the patient's satisfaction.     [name]

## 2021-04-04 ENCOUNTER — Encounter (HOSPITAL_BASED_OUTPATIENT_CLINIC_OR_DEPARTMENT_OTHER): Payer: Self-pay | Admitting: Orthopedic Surgery

## 2021-06-18 ENCOUNTER — Other Ambulatory Visit: Payer: Self-pay | Admitting: Endocrinology

## 2021-06-19 ENCOUNTER — Encounter: Payer: Self-pay | Admitting: Endocrinology

## 2021-06-19 ENCOUNTER — Ambulatory Visit (INDEPENDENT_AMBULATORY_CARE_PROVIDER_SITE_OTHER): Payer: No Typology Code available for payment source | Admitting: Endocrinology

## 2021-06-19 ENCOUNTER — Other Ambulatory Visit: Payer: Self-pay

## 2021-06-19 VITALS — BP 140/76 | HR 74 | Ht 63.0 in | Wt 197.6 lb

## 2021-06-19 DIAGNOSIS — E039 Hypothyroidism, unspecified: Secondary | ICD-10-CM

## 2021-06-19 DIAGNOSIS — Z794 Long term (current) use of insulin: Secondary | ICD-10-CM | POA: Diagnosis not present

## 2021-06-19 DIAGNOSIS — R252 Cramp and spasm: Secondary | ICD-10-CM | POA: Diagnosis not present

## 2021-06-19 DIAGNOSIS — E1165 Type 2 diabetes mellitus with hyperglycemia: Secondary | ICD-10-CM

## 2021-06-19 LAB — POCT GLYCOSYLATED HEMOGLOBIN (HGB A1C): Hemoglobin A1C: 7.9 % — AB (ref 4.0–5.6)

## 2021-06-19 LAB — COMPREHENSIVE METABOLIC PANEL
ALT: 31 U/L (ref 0–35)
AST: 23 U/L (ref 0–37)
Albumin: 4.4 g/dL (ref 3.5–5.2)
Alkaline Phosphatase: 68 U/L (ref 39–117)
BUN: 21 mg/dL (ref 6–23)
CO2: 31 mEq/L (ref 19–32)
Calcium: 9.8 mg/dL (ref 8.4–10.5)
Chloride: 102 mEq/L (ref 96–112)
Creatinine, Ser: 0.56 mg/dL (ref 0.40–1.20)
GFR: 99.33 mL/min (ref >60.00)
Glucose, Bld: 107 mg/dL — ABNORMAL HIGH (ref 70–99)
Potassium: 4.5 mEq/L (ref 3.5–5.1)
Sodium: 140 mEq/L (ref 135–145)
Total Bilirubin: 0.6 mg/dL (ref 0.2–1.2)
Total Protein: 7 g/dL (ref 6.0–8.3)

## 2021-06-19 LAB — TSH: TSH: 2.43 u[IU]/mL (ref 0.35–5.50)

## 2021-06-19 NOTE — Progress Notes (Signed)
Patient ID: Kara Dawson, female   DOB: 1960/08/07, 60 y.o.   MRN: [id number]          Reason for Appointment: Endocrinology follow-up  Referring physician: Ehinger   History of Present Illness:          Date of diagnosis of type 2 diabetes mellitus: 2004        Background history:   She has been on various oral hyperglycemic agents including metformin, Janumet and Amaryl in the past Apparently her blood sugars were fairly well controlled until 2012 when her A1c was 7.5 and subsequently it has been mostly higher above 8% She has tried other agents including Invokana and Marcelline Deist as well as Bydureon However her A1c has not improved with these changes She apparently had not lost weight with Bydureon and blood sugars were not better  Her baseline A1c was 9.3 in April 2018   Recent history:        Non-insulin hypoglycemic drugs the patient is taking are: Metformin 1000 twice a day, [name] 25 mg daily     INJECTABLE drugs: SOLIQUA 25 units in p.m.  Current management, blood sugar patterns and problems identified:  A1c is back up to 7.9, previously was better at 6.4 compared to 7.6   She says her blood sugars are higher because of recurrent sinusitis and not being able to exercise  She appears to be still having mostly high readings in the mornings fasting although this week appears to be getting a little better with fasting reading 105 yesterday  She was told that her antibiotic interferes with the absorption of Soliqua  Initially when she had high sugars she went up to 30 units of Soliqua but now back on 25  She really has fairly good blood sugars before and after supper with only occasional readings around 160-175  Weight has gone up although she thinks it is starting to come back down again  She thinks that when she was having sinusitis and taking antibiotics her appetite was increased  Has been able to continue [name] consistently without side effects        Dinner  is usually at 5-6  pm  Side effects from medications have been: None  Compliance with the medical regimen: Fair  Glucose monitoring:  done at least 1  times a day         Glucometer:  Contour     Blood Glucose readings by review of download   PRE-MEAL Morning Lunch Evening Bedtime Overall  Glucose range:     105-219  Mean/median: 174  143  158   POST-MEAL PC Breakfast PC Lunch PC Dinner  Glucose range:     Mean/median:      Prior  PRE-MEAL Fasting Lunch Dinner Bedtime Overall  Glucose range:     109-199  Mean/median: 149  133  145   POST-MEAL PC Breakfast PC Lunch PC Dinner  Glucose range:   168  Mean/median:        Self-care:      Typical meal intake: Breakfast is protein shake or egg and toast               Dietician visit, most recent: 2013               Weight history: Previous range 180-281  Wt Readings from Last 3 Encounters:  06/19/21 197 lb 9.6 oz (89.6 kg)  04/03/21 193 lb 3.2 oz (87.6 kg)  03/20/21 192 lb 6.4 oz (87.3  kg)    Glycemic control:    Lab Results  Component Value Date   HGBA1C 7.9 (A) 06/19/2021   HGBA1C 7.3 (A) 03/20/2021   HGBA1C 6.4 (A) 11/14/2020   Lab Results  Component Value Date   MICROALBUR <0.7 03/20/2021   LDLCALC 77 03/20/2021   CREATININE 0.40 (L) 04/03/2021   Lab Results  Component Value Date   MICRALBCREAT 1.6 03/20/2021    Lab Results  Component Value Date   FRUCTOSAMINE 255 10/13/2019     Other active problems: See review of systems   Allergies as of 06/19/2021       Reactions   Penicillins Rash        Medication List        Accurate as of June 19, 2021 10:44 AM. If you have any questions, ask your nurse or doctor.          aspirin EC 81 MG tablet Take 81 mg by mouth daily. Stopped over weekend 03/29/2021   atorvastatin 80 MG tablet Commonly known as: LIPITOR TAKE ONE-HALF TABLET BY MOUTH EVERY DAY FOR CHOLESTEROL What changed: See the new instructions.   ezetimibe 10 MG  tablet Commonly known as: ZETIA TAKE 1 TABLET BY MOUTH DAILY. TAKE ALONG WITH ATORVASTATIN What changed: See the new instructions.   [name] 25 MG Tabs tablet Generic drug: empagliflozin TAKE 1 TABLET BY MOUTH EVERY DAY What changed: how much to take   L-Lysine 500 MG Caps Take by mouth. Takes one daily   levothyroxine 50 MCG tablet Commonly known as: SYNTHROID TAKE 1 TABLET BY MOUTH EVERY MORNING ON AN EMPTY STOMACH What changed:  when to take this additional instructions   magnesium gluconate 500 MG tablet Commonly known as: MAGONATE Take 500 mg by mouth daily.   metFORMIN 1000 MG tablet Commonly known as: GLUCOPHAGE TAKE 1 TABLET BY MOUTH 2 TIMES A DAY WITH A MEAL What changed: See the new instructions.   MULTIPLE VITAMIN PO Take by mouth. Takes one vitamin daily   OneTouch Verio test strip Generic drug: glucose blood USE TO TEST BLOOD SUGAR TWICE DAILY DX CODE E11.65   Contour Next Test test strip Generic drug: glucose blood Use Contour test strips as instructed to check blood sugar twice daily.   Soliqua 100-33 UNT-MCG/ML Sopn Generic drug: Insulin Glargine-Lixisenatide INJECT 10 UNITS UNDER THE SKIN IN AM AND 34 UNITS IN THE AFTERNOON What changed: See the new instructions.   Vitamin D3 25 MCG (1000 UT) Caps Take 1,000 Units by mouth 2 (two) times daily. Takes one  twice daily in the evening.        Allergies:  Allergies  Allergen Reactions   Penicillins Rash    Past Medical History:  Diagnosis Date   Allergy    Diabetes mellitus without complication (HCC)    Hyperlipidemia    Hypothyroidism    Thyroid disease    Wears glasses    03/31/2021   Wears glasses    stated on 03/31/2021    Past Surgical History:  Procedure Laterality Date   CESAREAN SECTION     1984 and 1993. 03/31/2021   CHOLECYSTECTOMY     1982. 03/31/2021   DILATION AND CURETTAGE OF UTERUS     missed ab 1988. 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 She has never used smokeless tobacco. She reports that she does not currently use drugs. She reports that she does not drink alcohol.   Review of Systems   Lipid history: On statin treatment: Lipitor 80 mg and using half tablet every other day   Has less muscle aches with reducing her atorvastatin and continues to take Zetia Intolerant to Crestor Zetia was started on her visit in 3/20  She has had good control with continuing low-fat diet and weight loss   Labs as follows:   Lab Results  Component Value Date   CHOL 151 03/20/2021   CHOL 129 11/14/2020   CHOL 145 05/10/2020   Lab Results  Component Value Date   HDL 52.30 03/20/2021   HDL 47.70 11/14/2020   HDL 44.10 05/10/2020   Lab Results  Component Value Date   LDLCALC 77 03/20/2021   LDLCALC 64 11/14/2020   LDLCALC 79 05/10/2020   Lab Results  Component Value Date   TRIG 109.0 03/20/2021   TRIG 87.0 11/14/2020   TRIG 106.0 05/10/2020   Lab Results  Component Value Date   CHOLHDL 3 03/20/2021   CHOLHDL 3 11/14/2020   CHOLHDL 3 05/10/2020   No results found for: LDLDIRECT          Most recent eye exam was 7/22, no retinopathy  Most recent foot exam: 12/22  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 No recurrence of hypercalcemia  Bone density in 2020 was normal  On 4000 U D3 for supplementation with previously adequate levels  Lab Results  Component Value Date   PTH 12 (L) 02/09/2018   CALCIUM 9.6 03/20/2021   CAION 1.26 04/03/2021      Mild hypothyroidism: She has been on 50 mcg of levothyroxine for several years, not clear what her baseline TSH was  Has been on a stable dose of levothyroxine with normal TSH levels No complaints of fatigue  LABS:   Lab Results  Component Value Date   TSH 2.88 03/20/2021    Leg pain: She says she has some throbbing-like discomfort in the evenings when she comes back from work This may get better when she lies down to sleep No tingling, numbness or pins-and-needles No recent muscle cramps usually  Physical Examination:  BP 140/76   Pulse 74   Ht 5\' 3"  (1.6 m)   Wt 197 lb 9.6 oz (89.6 kg)   SpO2 99%   BMI 35.00 kg/m   Diabetic Foot Exam - Simple   Simple Foot Form Diabetic Foot exam was performed with the following findings: Yes   Visual Inspection No deformities, no ulcerations, no other skin breakdown bilaterally: Yes Sensation Testing Intact to touch and monofilament testing bilaterally: Yes Pulse Check Posterior Tibialis and Dorsalis pulse intact bilaterally: Yes See comments: Yes Comments Absent left dorsalis pedis      ASSESSMENT:  Diabetes type 2, with morbid obesity  See history of present illness for detailed discussion of current diabetes management, blood sugar patterns and problems identified  Her A1c is unusually high at 7.9  Current regimen: Soliqua 25 units at dinnertime, [name] 25 mg and metformin 1 g twice daily  Blood sugars have been  more difficult to control This is from inadequate diet, minimal exercise and intercurrent illnesses Also has gained weight She tends to have relatively higher fasting readings when not controlled but also not clear if she has high readings after lunch which she does not monitor  Her fasting readings are generally the highest but she has not done enough readings after meals  HYPERCHOLESTEROLEMIA: Previously well controlled   Hypothyroidism: Generally stable on the same dose of 50 mcg   PLAN:    No change in [place], will continue 25 units unless morning sugars start going up again She will restart walking for exercise regularly Try to check more readings on her monitor 2 hours after lunch and dinner and not regularly before dinner She will change the time on her meter Continue metformin and [name]  Encourage her to check her feet regularly  If she can try leaving off her atorvastatin for 2 weeks to see if it makes a difference in her leg pain and call if it does May consider L[place]o  Follow-up in 3 months   Labs to be drawn today for chemistry and thyroid    Patient Instructions  Check blood sugars on waking up 3-4  days a week  Also check blood sugars about 2 hours after meals and do this after different meals by rotation  Recommended blood sugar levels on waking up are 90-130 and about 2 hours after meal is 130-160  Please bring your blood sugar monitor to each visit, thank you        Reather Littler 06/19/2021, 10:44 AM   Note: This office note was prepared with Dragon voice recognition system technology. Any transcriptional errors that result from this process are unintentional.

## 2021-06-19 NOTE — Patient Instructions (Signed)
Check blood sugars on waking up 3-4 days a week  Also check blood sugars about 2 hours after meals and do this after different meals by rotation  Recommended blood sugar levels on waking up are 90-130 and about 2 hours after meal is 130-160  Please bring your blood sugar monitor to each visit, thank you   

## 2021-06-20 NOTE — Progress Notes (Signed)
Please call to let patient know that the lab results are normal and no further action needed

## 2021-09-19 ENCOUNTER — Ambulatory Visit (INDEPENDENT_AMBULATORY_CARE_PROVIDER_SITE_OTHER): Payer: No Typology Code available for payment source | Admitting: Endocrinology

## 2021-09-19 ENCOUNTER — Other Ambulatory Visit: Payer: Self-pay

## 2021-09-19 ENCOUNTER — Encounter: Payer: Self-pay | Admitting: Endocrinology

## 2021-09-19 VITALS — BP 122/60 | HR 76 | Ht 63.0 in | Wt 192.6 lb

## 2021-09-19 DIAGNOSIS — E1165 Type 2 diabetes mellitus with hyperglycemia: Secondary | ICD-10-CM

## 2021-09-19 DIAGNOSIS — E7800 Pure hypercholesterolemia, unspecified: Secondary | ICD-10-CM

## 2021-09-19 DIAGNOSIS — Z794 Long term (current) use of insulin: Secondary | ICD-10-CM | POA: Diagnosis not present

## 2021-09-19 DIAGNOSIS — E039 Hypothyroidism, unspecified: Secondary | ICD-10-CM | POA: Diagnosis not present

## 2021-09-19 DIAGNOSIS — E559 Vitamin D deficiency, unspecified: Secondary | ICD-10-CM | POA: Diagnosis not present

## 2021-09-19 LAB — COMPREHENSIVE METABOLIC PANEL
ALT: 26 U/L (ref 0–35)
AST: 20 U/L (ref 0–37)
Albumin: 4.4 g/dL (ref 3.5–5.2)
Alkaline Phosphatase: 56 U/L (ref 39–117)
BUN: 25 mg/dL — ABNORMAL HIGH (ref 6–23)
CO2: 31 mEq/L (ref 19–32)
Calcium: 9.8 mg/dL (ref 8.4–10.5)
Chloride: 102 mEq/L (ref 96–112)
Creatinine, Ser: 0.53 mg/dL (ref 0.40–1.20)
GFR: 100.48 mL/min (ref >60.00)
Glucose, Bld: 130 mg/dL — ABNORMAL HIGH (ref 70–99)
Potassium: 4.5 mEq/L (ref 3.5–5.1)
Sodium: 140 mEq/L (ref 135–145)
Total Bilirubin: 0.6 mg/dL (ref 0.2–1.2)
Total Protein: 6.7 g/dL (ref 6.0–8.3)

## 2021-09-19 LAB — POCT GLYCOSYLATED HEMOGLOBIN (HGB A1C): Hemoglobin A1C: 6.9 % — AB (ref 4.0–5.6)

## 2021-09-19 LAB — VITAMIN D 25 HYDROXY (VIT D DEFICIENCY, FRACTURES): VITD: 51.45 ng/mL (ref 30.00–100.00)

## 2021-09-19 LAB — LIPID PANEL
Cholesterol: 158 mg/dL (ref 0–200)
HDL: 51.4 mg/dL (ref >39.00)
LDL Cholesterol: 86 mg/dL (ref 0–99)
NonHDL: 106.58
Total CHOL/HDL Ratio: 3
Triglycerides: 103 mg/dL (ref 0.0–149.0)
VLDL: 20.6 mg/dL (ref 0.0–40.0)

## 2021-09-19 NOTE — Progress Notes (Signed)
Patient ID: Kara Dawson, female   DOB: 19-Mar-1961, 61 y.o.   MRN: [id number]          Reason for Appointment: Endocrinology follow-up  Referring physician: Ehinger   History of Present Illness:          Date of diagnosis of type 2 diabetes mellitus: 2004        Background history:   She has been on various oral hyperglycemic agents including metformin, Janumet and Amaryl in the past Apparently her blood sugars were fairly well controlled until 2012 when her A1c was 7.5 and subsequently it has been mostly higher above 8% She has tried other agents including Invokana and Farxiga as well as Bydureon However her A1c has not improved with these changes She apparently had not lost weight with Bydureon and blood sugars were not better  Her baseline A1c was 9.3 in April 2018   Recent history:        Non-insulin hypoglycemic drugs the patient is taking are: Metformin 1000 twice a day, [name] 25 mg daily     INJECTABLE drugs: SOLIQUA 24 units before dinner  Current management, blood sugar patterns and problems identified:  A1c is back down to 6.9, more recent range 6.4-7.9   She has lost 5 pounds since her last visit  She has regular meals better and is cutting back on portions especially after her last visit when she was concerned about her A1c  Although she has not done consistent exercise she is starting to do more walking now recently  Also previously her blood sugars had gone up because of sinusitis and other issues Recently has been feeling well Blood sugars are excellent although not checking enough readings after dinner, mostly checking fasting readings Blood sugar data and range is below       Dinner is usually at 5-6  pm  Side effects from medications have been: None  Compliance with the medical regimen: Fair  Glucose monitoring:  done at least 1  times a day         Glucometer:  Contour     Blood Glucose readings by review of download   PRE-MEAL Fasting  Lunch Dinner Bedtime Overall  Glucose range: 100-148  118-141    Mean/median: 126  130  126   POST-MEAL PC Breakfast PC Lunch PC Dinner  Glucose range: 100-161  103-142  Mean/median:   122   Previously:  PRE-MEAL Morning Lunch Evening Bedtime Overall  Glucose range:     105-219  Mean/median: 174  143  158   POST-MEAL PC Breakfast PC Lunch PC Dinner  Glucose range:     Mean/median:      Self-care:      Typical meal intake: Breakfast is protein shake or egg and toast               Dietician visit, most recent: 2013               Weight history: Previous range 180-281  Wt Readings from Last 3 Encounters:  09/19/21 192 lb 9.6 oz (87.4 kg)  06/19/21 197 lb 9.6 oz (89.6 kg)  04/03/21 193 lb 3.2 oz (87.6 kg)    Glycemic control:    Lab Results  Component Value Date   HGBA1C 6.9 (A) 09/19/2021   HGBA1C 7.9 (A) 06/19/2021   HGBA1C 7.3 (A) 03/20/2021   Lab Results  Component Value Date   MICROALBUR <0.7 03/20/2021   LDLCALC 77 03/20/2021   CREATININE 0.56  06/19/2021   Lab Results  Component Value Date   MICRALBCREAT 1.6 03/20/2021    Lab Results  Component Value Date   FRUCTOSAMINE 255 10/13/2019     Other active problems: See review of systems   Allergies as of 09/19/2021       Reactions   Penicillins Rash        Medication List        Accurate as of September 19, 2021  9:28 AM. If you have any questions, ask your nurse or doctor.          aspirin EC 81 MG tablet Take 81 mg by mouth daily. Stopped over weekend 03/29/2021   atorvastatin 80 MG tablet Commonly known as: LIPITOR TAKE ONE-HALF TABLET BY MOUTH EVERY DAY FOR CHOLESTEROL What changed: See the new instructions.   ezetimibe 10 MG tablet Commonly known as: ZETIA TAKE 1 TABLET BY MOUTH DAILY. TAKE ALONG WITH ATORVASTATIN What changed: See the new instructions.   [name] 25 MG Tabs tablet Generic drug: empagliflozin TAKE 1 TABLET BY MOUTH EVERY DAY   L-Lysine 500 MG Caps Take by  mouth. Takes one daily   levothyroxine 50 MCG tablet Commonly known as: SYNTHROID TAKE 1 TABLET BY MOUTH EVERY MORNING ON AN EMPTY STOMACH What changed:  when to take this additional instructions   magnesium gluconate 500 MG tablet Commonly known as: MAGONATE Take 500 mg by mouth daily.   metFORMIN 1000 MG tablet Commonly known as: GLUCOPHAGE TAKE 1 TABLET BY MOUTH 2 TIMES A DAY WITH A MEAL What changed: See the new instructions.   MULTIPLE VITAMIN PO Take by mouth. Takes one vitamin daily   OneTouch Verio test strip Generic drug: glucose blood USE TO TEST BLOOD SUGAR TWICE DAILY DX CODE E11.65   Contour Next Test test strip Generic drug: glucose blood Use Contour test strips as instructed to check blood sugar twice daily.   Soliqua 100-33 UNT-MCG/ML Sopn Generic drug: Insulin Glargine-Lixisenatide INJECT 10 UNITS UNDER THE SKIN IN AM AND 34 UNITS IN THE AFTERNOON What changed: See the new instructions.   Vitamin D3 25 MCG (1000 UT) Caps Take 1,000 Units by mouth 2 (two) times daily. Takes one  twice daily in the evening.        Allergies:  Allergies  Allergen Reactions   Penicillins Rash    Past Medical History:  Diagnosis Date   Allergy    Diabetes mellitus without complication ([place])    Hyperlipidemia    Hypothyroidism    Thyroid disease    Wears glasses    03/31/2021   Wears glasses    stated on 03/31/2021    Past Surgical History:  Procedure Laterality Date   [place] and 1993. 03/31/2021   CHOLECYSTECTOMY     1982. 03/31/2021   DILATION AND CURETTAGE OF UTERUS     missed ab 1988. 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 She has never used smokeless tobacco. She reports  that she does not currently use drugs. She reports that she does not drink alcohol.   Review of Systems   Lipid history: On statin treatment: Lipitor 80 mg and using half tablet every other day   Has less muscle aches with reducing her atorvastatin and continues to take Zetia Intolerant to Crestor Zetia was started on her visit in 3/20  She has had good control with continuing low-fat diet and weight loss   Labs as follows:   Lab Results  Component Value Date   CHOL 151 03/20/2021   CHOL 129 11/14/2020   CHOL 145 05/10/2020   Lab Results  Component Value Date   HDL 52.30 03/20/2021   HDL 47.70 11/14/2020   HDL 44.10 05/10/2020   Lab Results  Component Value Date   LDLCALC 77 03/20/2021   LDLCALC 64 11/14/2020   LDLCALC 79 05/10/2020   Lab Results  Component Value Date   TRIG 109.0 03/20/2021   TRIG 87.0 11/14/2020   TRIG 106.0 05/10/2020   Lab Results  Component Value Date   CHOLHDL 3 03/20/2021   CHOLHDL 3 11/14/2020   CHOLHDL 3 05/10/2020   No results found for: LDLDIRECT          Most recent eye exam was 7/22, no retinopathy  Most recent foot exam: 12/22  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 No recurrence of hypercalcemia  Bone density in 2020 was normal  On 4000 U D3 for supplementation with previously adequate levels  Lab Results  Component Value Date   PTH 12 (L) 02/09/2018   CALCIUM 9.8 06/19/2021   CAION 1.26 04/03/2021     Mild hypothyroidism: She has been on 50 mcg of levothyroxine for several years, not clear what her baseline TSH was  Has been on a stable dose of levothyroxine with normal TSH levels No complaints of fatigue  LABS:   Lab Results  Component Value Date   TSH 2.43 06/19/2021    Leg  pain: She says she has some throbbing-like discomfort in the evenings when she comes back from work This may get better when she lies down to sleep No tingling, numbness or pins-and-needles No recent muscle cramps usually  Physical Examination:  BP 122/60    Pulse 76    Ht 5\' 3"  (1.6 m)    Wt 192 lb 9.6 oz (87.4 kg)    SpO2 98%    BMI 34.12 kg/m    ASSESSMENT:  Diabetes type 2, with morbid obesity  See history of present illness for detailed discussion of current diabetes management, blood sugar patterns and problems identified  Her A1c is improved significantly at 6.9 compared to 7.9  Current regimen: Soliqua 25 units at dinnertime, [name] 25 mg and metformin 1 g twice daily  Blood sugars have been significantly better with improving her lifestyle and also not having issues like sinusitis interfering with her controlled Has lost some weight   HYPERCHOLESTEROLEMIA: Previously well controlled 1 will need follow-up   Hypothyroidism: Has been controlled well long-term on the same dose of 50 mcg   PLAN:   No change in [place], [name] or metformin To check blood sugars by rotation at different times including after meals Regular walking for exercise Continue improving diet  Continue atorvastatin every other day as before and will recheck lipids today  Follow-up in 4 months    There are no Patient Instructions on file for this visit.        Elayne Snare 09/19/2021, 9:28  AM   Note: This office note was prepared with Estate agent. Any transcriptional errors that result from this process are unintentional.

## 2021-09-28 ENCOUNTER — Other Ambulatory Visit: Payer: Self-pay | Admitting: Endocrinology

## 2021-10-23 ENCOUNTER — Other Ambulatory Visit: Payer: Self-pay | Admitting: Endocrinology

## 2022-01-09 ENCOUNTER — Ambulatory Visit: Payer: No Typology Code available for payment source | Admitting: Endocrinology

## 2022-01-16 ENCOUNTER — Encounter: Payer: Self-pay | Admitting: Endocrinology

## 2022-01-16 ENCOUNTER — Ambulatory Visit: Payer: No Typology Code available for payment source | Admitting: Endocrinology

## 2022-01-16 ENCOUNTER — Ambulatory Visit (INDEPENDENT_AMBULATORY_CARE_PROVIDER_SITE_OTHER): Payer: No Typology Code available for payment source | Admitting: Endocrinology

## 2022-01-16 VITALS — BP 124/70 | HR 80 | Ht 64.0 in | Wt 200.0 lb

## 2022-01-16 DIAGNOSIS — E1165 Type 2 diabetes mellitus with hyperglycemia: Secondary | ICD-10-CM

## 2022-01-16 DIAGNOSIS — E7800 Pure hypercholesterolemia, unspecified: Secondary | ICD-10-CM

## 2022-01-16 DIAGNOSIS — E063 Autoimmune thyroiditis: Secondary | ICD-10-CM | POA: Diagnosis not present

## 2022-01-16 DIAGNOSIS — Z794 Long term (current) use of insulin: Secondary | ICD-10-CM

## 2022-01-16 LAB — MICROALBUMIN / CREATININE URINE RATIO
Creatinine,U: 49 mg/dL
Microalb Creat Ratio: 1.4 mg/g (ref 0.0–30.0)
Microalb, Ur: <0.7 mg/dL (ref 0.0–1.9)

## 2022-01-16 LAB — COMPREHENSIVE METABOLIC PANEL
ALT: 22 U/L (ref 0–35)
AST: 21 U/L (ref 0–37)
Albumin: 4.6 g/dL (ref 3.5–5.2)
Alkaline Phosphatase: 62 U/L (ref 39–117)
BUN: 19 mg/dL (ref 6–23)
CO2: 31 mEq/L (ref 19–32)
Calcium: 9.8 mg/dL (ref 8.4–10.5)
Chloride: 103 mEq/L (ref 96–112)
Creatinine, Ser: 0.56 mg/dL (ref 0.40–1.20)
GFR: 98.93 mL/min (ref >60.00)
Glucose, Bld: 101 mg/dL — ABNORMAL HIGH (ref 70–99)
Potassium: 4.4 mEq/L (ref 3.5–5.1)
Sodium: 141 mEq/L (ref 135–145)
Total Bilirubin: 0.6 mg/dL (ref 0.2–1.2)
Total Protein: 7.4 g/dL (ref 6.0–8.3)

## 2022-01-16 LAB — POCT GLYCOSYLATED HEMOGLOBIN (HGB A1C): Hemoglobin A1C: 7.5 % — AB (ref 4.0–5.6)

## 2022-01-16 LAB — LIPID PANEL
Cholesterol: 156 mg/dL (ref 0–200)
HDL: 55.8 mg/dL (ref >39.00)
LDL Cholesterol: 82 mg/dL (ref 0–99)
NonHDL: 100.58
Total CHOL/HDL Ratio: 3
Triglycerides: 91 mg/dL (ref 0.0–149.0)
VLDL: 18.2 mg/dL (ref 0.0–40.0)

## 2022-01-16 LAB — TSH: TSH: 2.19 u[IU]/mL (ref 0.35–5.50)

## 2022-01-16 LAB — HEMOGLOBIN A1C: Hgb A1c MFr Bld: 7.8 % — ABNORMAL HIGH (ref 4.6–6.5)

## 2022-01-16 NOTE — Patient Instructions (Signed)
Check blood sugars on waking up 3-4 days a week  Also check blood sugars about 2 hours after meals and do this after different meals by rotation  Recommended blood sugar levels on waking up are 90-130 and about 2 hours after meal is 130-160  Please bring your blood sugar monitor to each visit, thank you  Check on Dexcom and [place]

## 2022-01-16 NOTE — Progress Notes (Signed)
Patient ID: Kara Dawson, female   DOB: [birth year], 61 y.o.   MRN: [id number]          Reason for Appointment: Endocrinology follow-up  Referring physician: Ehinger   History of Present Illness:          Date of diagnosis of type 2 diabetes mellitus: 2004        Background history:   She has been on various oral hyperglycemic agents including metformin, Janumet and Amaryl in the past Apparently her blood sugars were fairly well controlled until 2012 when her A1c was 7.5 and subsequently it has been mostly higher above 8% She has tried other agents including Invokana and Farxiga as well as Bydureon However her A1c has not improved with these changes She apparently had not lost weight with Bydureon and blood sugars were not better  Her baseline A1c was 9.3 in April 2018   Recent history:        Non-insulin hypoglycemic drugs the patient is taking are: Metformin 1000 twice a day, [name] 25 mg daily     INJECTABLE drugs: SOLIQUA 24 units before dinner  Current management, blood sugar patterns and problems identified:  A1c is back 7.5 compared to 6.9 otherwise more recent range 6.4-7.9   She has apparently gained a lot of weight likely from inconsistent diet and exercise However she thinks that in the last few weeks she has been able to get back on her walking program and watch her diet as well as lose 2 to 3 pounds She also had difficulty with sinusitis but not getting any prednisone Has been quite regular with all her medications and Soliqua Recently her blood sugars are similar to her last visit with highest reading 185 but this is unusual She does try to check her blood sugars as directed before breakfast or after meals       Dinner is usually at 5-6  pm  Side effects from medications have been: None  Compliance with the medical regimen: Fair  Glucose monitoring:  done at least 1  times a day         Glucometer:  Contour     Blood Glucose readings/averages by  review of download for the last 30 days   PRE-MEAL Fasting Lunch Dinner Bedtime Overall  Glucose range: 90-166      Mean/median: 121    130   POST-MEAL PC Breakfast PC Lunch PC Dinner  Glucose range:   115-185  Mean/median:  130 135   PREVIOUSLY:  PRE-MEAL Fasting Lunch Dinner Bedtime Overall  Glucose range: 100-148  118-141    Mean/median: 126  130  126   POST-MEAL PC Breakfast PC Lunch PC Dinner  Glucose range: 100-161  103-142  Mean/median:   122    Self-care:      Typical meal intake: Breakfast is protein shake or egg and toast               Dietician visit, most recent: 2013               Weight history: Previous range 180-281  Wt Readings from Last 3 Encounters:  01/16/22 200 lb (90.7 kg)  09/19/21 192 lb 9.6 oz (87.4 kg)  06/19/21 197 lb 9.6 oz (89.6 kg)    Glycemic control:    Lab Results  Component Value Date   HGBA1C 7.5 (A) 01/16/2022   HGBA1C 6.9 (A) 09/19/2021   HGBA1C 7.9 (A) 06/19/2021   Lab Results  Component Value Date  MICROALBUR <0.7 03/20/2021   LDLCALC 86 09/19/2021   CREATININE 0.53 09/19/2021   Lab Results  Component Value Date   MICRALBCREAT 1.6 03/20/2021    Lab Results  Component Value Date   FRUCTOSAMINE 255 10/13/2019     Other active problems: See review of systems   Allergies as of 01/16/2022       Reactions   Penicillins Rash        Medication List        Accurate as of January 16, 2022 10:07 AM. If you have any questions, ask your nurse or doctor.          aspirin EC 81 MG tablet Take 81 mg by mouth daily. Stopped over weekend 03/29/2021   atorvastatin 80 MG tablet Commonly known as: LIPITOR TAKE ONE-HALF TABLET BY MOUTH EVERY DAY FOR CHOLESTEROL What changed: See the new instructions.   ezetimibe 10 MG tablet Commonly known as: ZETIA TAKE 1 TABLET BY MOUTH DAILY. TAKE ALONG WITH ATORVASTATIN   [name] 25 MG Tabs tablet Generic drug: empagliflozin TAKE 1 TABLET BY MOUTH EVERY DAY    L-Lysine 500 MG Caps Take by mouth. Takes one daily   levothyroxine 50 MCG tablet Commonly known as: SYNTHROID TAKE 1 TABLET BY MOUTH EVERY MORNING ON AN EMPTY STOMACH What changed:  when to take this additional instructions   magnesium gluconate 500 MG tablet Commonly known as: MAGONATE Take 500 mg by mouth daily.   metFORMIN 1000 MG tablet Commonly known as: GLUCOPHAGE TAKE 1 TABLET BY MOUTH 2 TIMES A DAY WITH A MEAL   MULTIPLE VITAMIN PO Take by mouth. Takes one vitamin daily   OneTouch Verio test strip Generic drug: glucose blood USE TO TEST BLOOD SUGAR TWICE DAILY DX CODE E11.65   Contour Next Test test strip Generic drug: glucose blood Use Contour test strips as instructed to check blood sugar twice daily.   Soliqua 100-33 UNT-MCG/ML Sopn Generic drug: Insulin Glargine-Lixisenatide INJECT 10 UNITS UNDER THE SKIN IN AM AND 34 UNITS IN THE AFTERNOON What changed: See the new instructions.   Vitamin D3 25 MCG (1000 UT) Caps Take 1,000 Units by mouth 2 (two) times daily. Takes one  twice daily in the evening.        Allergies:  Allergies  Allergen Reactions   Penicillins Rash    Past Medical History:  Diagnosis Date   Allergy    Diabetes mellitus without complication (HCC)    Hyperlipidemia    Hypothyroidism    Thyroid disease    Wears glasses    03/31/2021   Wears glasses    stated on 03/31/2021    Past Surgical History:  Procedure Laterality Date   CESAREAN SECTION     1984 and 1993. 03/31/2021   CHOLECYSTECTOMY     1982. 03/31/2021   DILATION AND CURETTAGE OF UTERUS     missed ab 1988. 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 She has never used smokeless tobacco. She reports that  she does not currently use drugs. She reports that she does not drink alcohol.   Review of Systems   Lipid history: On statin treatment: Lipitor 80 mg and using half tablet every other day   Has less muscle aches with reducing her atorvastatin and continues to take Zetia Intolerant to Crestor Zetia was started on her visit in 3/20  Labs as follows:   Lab Results  Component Value Date   CHOL 158 09/19/2021   CHOL 151 03/20/2021   CHOL 129 11/14/2020   Lab Results  Component Value Date   HDL 51.40 09/19/2021   HDL 52.30 03/20/2021   HDL 47.70 11/14/2020   Lab Results  Component Value Date   LDLCALC 86 09/19/2021   LDLCALC 77 03/20/2021   LDLCALC 64 11/14/2020   Lab Results  Component Value Date   TRIG 103.0 09/19/2021   TRIG 109.0 03/20/2021   TRIG 87.0 11/14/2020   Lab Results  Component Value Date   CHOLHDL 3 09/19/2021   CHOLHDL 3 03/20/2021   CHOLHDL 3 11/14/2020   No results found for: "LDLDIRECT"          Most recent eye exam was 7/22, no retinopathy  Most recent foot exam: 12/22  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 No recurrence of hypercalcemia  Bone density in 2020 was normal  On 4000 U D3 for supplementation with previously adequate levels  Lab Results  Component Value Date   PTH 12 (L) 02/09/2018   CALCIUM 9.8 09/19/2021   CAION 1.26 04/03/2021     Mild hypothyroidism: She has been on 50 mcg of levothyroxine for several years, not clear what her baseline TSH was  Has been on a stable dose of levothyroxine with normal TSH levels No complaints of fatigue  LABS:   Lab Results  Component Value Date   TSH 2.43 06/19/2021    Leg pain: better than before No tingling, numbness or pins-and-needles No  recent muscle cramps   Physical Examination:  BP 124/70   Pulse 80   Ht 5\' 4"  (1.626 m)   Wt 200 lb (90.7 kg)   SpO2 95%   BMI 34.33 kg/m    ASSESSMENT:  Diabetes type 2, with morbid obesity  See history of present illness for detailed discussion of current diabetes management, blood sugar patterns and problems identified  Her A1c is higher at 7.5  Current regimen: Soliqua 25 units at dinnertime, [name] 25 mg and metformin 1 g twice daily  Blood sugars have been similar to her last visit but A1c is higher This is likely to be from previous higher blood sugars from inconsistent diet and exercise and also given intercurrent infections She has gained weight since her last visit Currently not seeing any consistently high postprandial readings   HYPERCHOLESTEROLEMIA: Previously well controlled      PLAN: 6/23  She will continue to be consistent with her walking for exercise and meal planning limiting carbohydrates and calories She will also look into the continuous glucose monitoring devices to see if they are covered, given her names for these Her medications will be continued unchanged for now  We will need to recheck her lipids, thyroid, urine microalbumin and chemistry panel  Follow-up in 3 months to make sure her A1c is coming down     Patient Instructions  Check blood sugars on waking up 3-4 days a week  Also check blood sugars about 2 hours after meals and do this after different meals by rotation  Recommended blood sugar levels on waking up are 90-130 and about 2 hours after meal is 130-160  Please bring your blood sugar monitor to each visit, thank you  Check on Dexcom and Lind Guest 01/16/2022, 10:07 AM   Note: This office note was prepared with Dragon voice recognition system technology. Any transcriptional errors that result from this process are unintentional.   Addendum: All labs normal

## 2022-01-20 ENCOUNTER — Other Ambulatory Visit: Payer: Self-pay | Admitting: Endocrinology

## 2022-02-12 LAB — HM DIABETES EYE EXAM

## 2022-02-16 ENCOUNTER — Other Ambulatory Visit: Payer: Self-pay | Admitting: Endocrinology

## 2022-04-24 ENCOUNTER — Ambulatory Visit: Payer: No Typology Code available for payment source | Admitting: Endocrinology

## 2022-04-24 ENCOUNTER — Encounter: Payer: Self-pay | Admitting: Endocrinology

## 2022-04-24 VITALS — BP 122/78 | HR 76 | Ht 63.0 in | Wt 204.8 lb

## 2022-04-24 DIAGNOSIS — E1165 Type 2 diabetes mellitus with hyperglycemia: Secondary | ICD-10-CM

## 2022-04-24 DIAGNOSIS — E063 Autoimmune thyroiditis: Secondary | ICD-10-CM

## 2022-04-24 DIAGNOSIS — Z23 Encounter for immunization: Secondary | ICD-10-CM | POA: Diagnosis not present

## 2022-04-24 LAB — POCT GLYCOSYLATED HEMOGLOBIN (HGB A1C): Hemoglobin A1C: 7.8 % — AB (ref 4.0–5.6)

## 2022-04-24 NOTE — Progress Notes (Signed)
Patient ID: Kara Dawson, female   DOB: 1960-08-03, 61 y.o.   MRN: [id number]          Reason for Appointment: Endocrinology follow-up  Referring physician: Ehinger   History of Present Illness:          Date of diagnosis of type 2 diabetes mellitus: 2004        Background history:   She has been on various oral hyperglycemic agents including metformin, Janumet and Amaryl in the past Apparently her blood sugars were fairly well controlled until 2012 when her A1c was 7.5 and subsequently it has been mostly higher above 8% She has tried other agents including Invokana and Farxiga as well as Bydureon However her A1c has not improved with these changes She apparently had not lost weight with Bydureon and blood sugars were not better  Her baseline A1c was 9.3 in April 2018   Recent history:        Non-insulin hypoglycemic drugs the patient is taking are: Metformin 1000 twice a day, [name] 25 mg daily     INJECTABLE drugs: SOLIQUA 26 units before dinner  Current management, blood sugar patterns and problems identified:  A1c is 7.8, previously 7.5 on the last visit with fingerstick Previous range 6.4-7.9 recently   She has reportedly gained back some of the weight she has lost previously Also her blood sugars are fairly consistently higher especially in the morning She takes [place] before dinnertime but has not increased the dose Has not had any other intercurrent illnesses or prednisone to explain her high readings She thinks her diet has been inconsistent Also has not done much exercise and has not had time to go outside for walking However very few readings after dinner, mostly checking at 6 PM or before Does not appear to have good coverage for her to get a CGM from her insurance Also taking metformin and [name] regularly       Dinner is usually at 5-6  pm  Side effects from medications have been: None  Compliance with the medical regimen: Fair  Glucose  monitoring:  done at least 1  times a day         Glucometer:  Contour     Blood Glucose readings/averages by review of download for the last 30 days   PRE-MEAL Fasting Lunch Dinner 5 PM-12 AM Overall  Glucose range: 150-182      Mean/median: 168 165 131 143    POST-MEAL PC Breakfast PC Lunch PC Dinner  Glucose range:     Mean/median:      Previously  PRE-MEAL Fasting Lunch Dinner Bedtime Overall  Glucose range: 90-166      Mean/median: 121    130   POST-MEAL PC Breakfast PC Lunch PC Dinner  Glucose range:   115-185  Mean/median:  130 135    Self-care:      Typical meal intake: Breakfast is protein shake or egg and toast               Dietician visit, most recent: 2013               Weight history: Previous range 180-281  Wt Readings from Last 3 Encounters:  04/24/22 204 lb 12.8 oz (92.9 kg)  01/16/22 200 lb (90.7 kg)  09/19/21 192 lb 9.6 oz (87.4 kg)    Glycemic control:    Lab Results  Component Value Date   HGBA1C 7.8 (A) 04/24/2022   HGBA1C 7.8 (H) 01/16/2022  HGBA1C 7.5 (A) 01/16/2022   Lab Results  Component Value Date   MICROALBUR <0.7 01/16/2022   LDLCALC 82 01/16/2022   CREATININE 0.56 01/16/2022      Lab Results  Component Value Date   MICRALBCREAT 1.4 01/16/2022    Lab Results  Component Value Date   FRUCTOSAMINE 255 10/13/2019     Other active problems: See review of systems   Allergies as of 04/24/2022       Reactions   Penicillins Rash        Medication List        Accurate as of April 24, 2022 11:59 PM. If you have any questions, ask your nurse or doctor.          aspirin EC 81 MG tablet Take 81 mg by mouth daily. Stopped over weekend 03/29/2021   atorvastatin 80 MG tablet Commonly known as: LIPITOR TAKE ONE-HALF TABLET BY MOUTH EVERY DAY FOR CHOLESTEROL What changed: See the new instructions.   ezetimibe 10 MG tablet Commonly known as: ZETIA TAKE 1 TABLET BY MOUTH DAILY. TAKE ALONG WITH ATORVASTATIN    [name] 25 MG Tabs tablet Generic drug: empagliflozin TAKE 1 TABLET BY MOUTH EVERY DAY   L-Lysine 500 MG Caps Take by mouth. Takes one daily   levothyroxine 50 MCG tablet Commonly known as: SYNTHROID TAKE 1 TABLET BY MOUTH EVERY MORNING ON AN EMPTY STOMACH What changed:  when to take this additional instructions   magnesium gluconate 500 MG tablet Commonly known as: MAGONATE Take 500 mg by mouth daily.   metFORMIN 1000 MG tablet Commonly known as: GLUCOPHAGE TAKE 1 TABLET BY MOUTH 2 TIMES A DAY WITH A MEAL   MULTIPLE VITAMIN PO Take by mouth. Takes one vitamin daily   OneTouch Verio test strip Generic drug: glucose blood USE TO TEST BLOOD SUGAR TWICE DAILY DX CODE E11.65   Contour Next Test test strip Generic drug: glucose blood Use Contour test strips as instructed to check blood sugar twice daily.   Soliqua 100-33 UNT-MCG/ML Sopn Generic drug: Insulin Glargine-Lixisenatide INJECT 10 UNITS UNDER THE SKIN IN AM AND 34 UNITS IN THE AFTERNOON What changed: See the new instructions.   Vitamin D3 25 MCG (1000 UT) Caps Take 1,000 Units by mouth 2 (two) times daily. Takes one  twice daily in the evening.        Allergies:  Allergies  Allergen Reactions   Penicillins Rash    Past Medical History:  Diagnosis Date   Allergy    Diabetes mellitus without complication ([place])    Hyperlipidemia    Hypothyroidism    Thyroid disease    Wears glasses    03/31/2021   Wears glasses    stated on 03/31/2021    Past Surgical History:  Procedure Laterality Date   [place] and 1993. 03/31/2021   CHOLECYSTECTOMY     1982. 03/31/2021   DILATION AND CURETTAGE OF UTERUS     missed ab 1988. 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 She has never used smokeless tobacco. She reports that she does not currently use drugs. She reports that she does not drink alcohol.   Review of Systems   Lipid history: On statin treatment: Lipitor 80 mg and using half tablet every other day   Has less muscle aches with reducing her atorvastatin and continues to take Zetia Intolerant to Crestor Zetia was started on her visit in 3/20  She thinks she has some muscle cramps early in the morning  Labs as follows:   Lab Results  Component Value Date   CHOL 156 01/16/2022   CHOL 158 09/19/2021   CHOL 151 03/20/2021   Lab Results  Component Value Date   HDL 55.80 01/16/2022   HDL 51.40 09/19/2021   HDL 52.30 03/20/2021   Lab Results  Component Value Date   LDLCALC 82 01/16/2022   LDLCALC 86 09/19/2021   LDLCALC 77 03/20/2021   Lab Results  Component Value Date   TRIG 91.0 01/16/2022   TRIG 103.0 09/19/2021   TRIG 109.0 03/20/2021   Lab Results  Component Value Date   CHOLHDL 3 01/16/2022   CHOLHDL 3 09/19/2021   CHOLHDL 3 03/20/2021   No results found for: "LDLDIRECT"          Most recent eye exam was 7/22, no retinopathy  Most recent foot exam: 12/22  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 No recurrence of hypercalcemia  Bone density in 2020 was normal  On 4000 U D3 for supplementation with previously adequate levels  Lab Results  Component Value Date   PTH 12 (L) 02/09/2018   CALCIUM 9.8 01/16/2022   CAION 1.26 04/03/2021     Mild hypothyroidism: She has been on 50 mcg of levothyroxine for several years, not clear what her baseline TSH was  Has been on a stable dose of levothyroxine with normal TSH levels No complaints of  fatigue  LABS:   Lab Results  Component Value Date   TSH 2.19 01/16/2022    History of mild neuropathy: No tingling, numbness or pins-and-needles recently   Physical Examination:  BP 122/78   Pulse 76   Ht 5\' 3"  (1.6 m)   Wt 204 lb 12.8 oz (92.9 kg)   SpO2 99%   BMI 36.28 kg/m    ASSESSMENT:  Diabetes type 2, with morbid obesity  See history of present illness for detailed discussion of current diabetes management, blood sugar patterns and problems identified  Her A1c is higher at 7.5  Current regimen: Soliqua 25 units at dinnertime, [name] 25 mg and metformin 1 g twice daily  Blood sugars have been similar to her last visit but A1c is higher This is likely to be from previous higher blood sugars from inconsistent diet and exercise and also given intercurrent infections She has gained weight since her last visit Currently not seeing any consistently high postprandial readings   HYPERCHOLESTEROLEMIA: Previously well controlled  Unclear whether her muscle cramps recently are related to atorvastatin or idiopathic since they are mostly early morning    PLAN:   She will need to increase her Soliqua to at least 30 units at dinnertime  If she has better coverage for a GLP-1 drug or Mounjaro we will switch her to 1 of these along with a basal insulin separately and she will also check the coverage for the preferred basal insulin If her blood sugars are not improving she will let know  Discussed differences between [facility] and above recommended drugs  Since she does not like to walk outside she will try to do some exercise videos at home using any device  She will consistently control portions and snacks especially late at night  Follow-up in 3 months to make sure her A1c is coming down   Total visit time including counseling = 30 minutes  Patient Instructions  Check blood sugars on waking up 4 days a week  Also check blood sugars about 2 hours after  meals and do this after different meals by rotation  Recommended blood sugar levels on waking up are 90-130 and about 2 hours after meal is 130-180  Please bring your blood sugar monitor to each visit, thank you  Soliqua at supper 30 units  Insulin: Lantus, Toujeo Evaristo Bury  GLP-1 options: Mounjaro, Ozempic or Trulicity, all weekly  Exercise videos        Kara Dawson 04/26/2022, 9:06 PM   Note: This office note was prepared with Dragon voice recognition system technology. Any transcriptional errors that result from this process are unintentional.

## 2022-04-24 NOTE — Patient Instructions (Addendum)
Check blood sugars on waking up 4 days a week  Also check blood sugars about 2 hours after meals and do this after different meals by rotation  Recommended blood sugar levels on waking up are 90-130 and about 2 hours after meal is 130-180  Please bring your blood sugar monitor to each visit, thank you  Soliqua at supper 30 units  Insulin: Lantus, Toujeo Tyler Aas  GLP-1 options: Mounjaro, Ozempic or Trulicity, all weekly  Exercise videos

## 2022-06-12 ENCOUNTER — Other Ambulatory Visit: Payer: Self-pay | Admitting: Endocrinology

## 2022-08-14 ENCOUNTER — Ambulatory Visit: Payer: No Typology Code available for payment source | Admitting: Endocrinology

## 2022-08-14 ENCOUNTER — Encounter: Payer: Self-pay | Admitting: Endocrinology

## 2022-08-14 VITALS — BP 124/72 | HR 80 | Ht 63.0 in | Wt 210.8 lb

## 2022-08-14 DIAGNOSIS — E039 Hypothyroidism, unspecified: Secondary | ICD-10-CM

## 2022-08-14 DIAGNOSIS — E559 Vitamin D deficiency, unspecified: Secondary | ICD-10-CM | POA: Diagnosis not present

## 2022-08-14 DIAGNOSIS — E7800 Pure hypercholesterolemia, unspecified: Secondary | ICD-10-CM | POA: Diagnosis not present

## 2022-08-14 DIAGNOSIS — E1165 Type 2 diabetes mellitus with hyperglycemia: Secondary | ICD-10-CM | POA: Diagnosis not present

## 2022-08-14 DIAGNOSIS — R252 Cramp and spasm: Secondary | ICD-10-CM

## 2022-08-14 LAB — POCT GLYCOSYLATED HEMOGLOBIN (HGB A1C): Hemoglobin A1C: 8.2 % — AB (ref 4.0–5.6)

## 2022-08-14 LAB — COMPREHENSIVE METABOLIC PANEL
ALT: 22 U/L (ref 0–35)
AST: 19 U/L (ref 0–37)
Albumin: 4.5 g/dL (ref 3.5–5.2)
Alkaline Phosphatase: 70 U/L (ref 39–117)
BUN: 23 mg/dL (ref 6–23)
CO2: 28 mEq/L (ref 19–32)
Calcium: 9.3 mg/dL (ref 8.4–10.5)
Chloride: 103 mEq/L (ref 96–112)
Creatinine, Ser: 0.47 mg/dL (ref 0.40–1.20)
GFR: 102.78 mL/min (ref >60.00)
Glucose, Bld: 142 mg/dL — ABNORMAL HIGH (ref 70–99)
Potassium: 4.5 mEq/L (ref 3.5–5.1)
Sodium: 141 mEq/L (ref 135–145)
Total Bilirubin: 0.4 mg/dL (ref 0.2–1.2)
Total Protein: 7.5 g/dL (ref 6.0–8.3)

## 2022-08-14 LAB — CBC
HCT: 42.5 % (ref 36.0–46.0)
Hemoglobin: 13.9 g/dL (ref 12.0–15.0)
MCHC: 32.8 g/dL (ref 30.0–36.0)
MCV: 75.9 fl — ABNORMAL LOW (ref 78.0–100.0)
Platelets: 336 10*3/uL (ref 150.0–400.0)
RBC: 5.59 Mil/uL — ABNORMAL HIGH (ref 3.87–5.11)
RDW: 20.9 % — ABNORMAL HIGH (ref 11.5–15.5)
WBC: 8.5 10*3/uL (ref 4.0–10.5)

## 2022-08-14 LAB — LIPID PANEL
Cholesterol: 151 mg/dL (ref 0–200)
HDL: 55.5 mg/dL (ref >39.00)
LDL Cholesterol: 78 mg/dL (ref 0–99)
NonHDL: 95.74
Total CHOL/HDL Ratio: 3
Triglycerides: 88 mg/dL (ref 0.0–149.0)
VLDL: 17.6 mg/dL (ref 0.0–40.0)

## 2022-08-14 LAB — VITAMIN D 25 HYDROXY (VIT D DEFICIENCY, FRACTURES): VITD: 45.79 ng/mL (ref 30.00–100.00)

## 2022-08-14 NOTE — Patient Instructions (Signed)
Take 1/2 Metformin and 1 in pm  Soliqua 38 at dinner

## 2022-08-14 NOTE — Progress Notes (Signed)
Patient ID: Kara Dawson, female   DOB: Sep 22, 1960, 62 y.o.   MRN: [id number]          Reason for Appointment: Endocrinology follow-up  Referring physician: Ehinger   History of Present Illness:          Date of diagnosis of type 2 diabetes mellitus: 2004        Background history:   She has been on various oral hyperglycemic agents including metformin, Janumet and Amaryl in the past Apparently her blood sugars were fairly well controlled until 2012 when her A1c was 7.5 and subsequently it has been mostly higher above 8% She has tried other agents including Invokana and Farxiga as well as Bydureon However her A1c has not improved with these changes She apparently had not lost weight with Bydureon and blood sugars were not better  Her baseline A1c was 9.3 in April 2018   Recent history:        Non-insulin hypoglycemic drugs the patient is taking are: Metformin 1000 twice a day, [name] 25 mg daily     INJECTABLE drugs: SOLIQUA 35 units before dinner  Current management, blood sugar patterns and problems identified:  A1c is 8.2 and higher Previous range 6.4-7.9 recently   She had lost some weight in December but she thinks that at the end of last month she had COVID and subsequently flu and her blood sugars went up to as high as 333  She has gone up another 5 units on her Willeen Niece since her last visit recently but her morning sugars are still in the 160-180 range  Also because of significant fatigue she is not exercising She thinks she has regained her weight with these factors She is not taking her sugars after dinner much likely has only 2 readings earlier this month which are not high Does not appear to have good coverage for her to get a CGM from her insurance She is taking metformin and [name] regularly She does think that she may be getting more snacks than she needs late in the evening when she is more hungry       Dinner is usually at 5-6  pm  Side effects  from medications have been: None  Compliance with the medical regimen: Fair  Glucose monitoring:  done at least 1  times a day         Glucometer:  Contour     Blood Glucose readings/averages by review of download for the last 30 days   PRE-MEAL Fasting Lunch Evening Bedtime Overall  Glucose range: 141-195      Mean/median: 160  160  173   POST-MEAL PC Breakfast PC Lunch PC Dinner  Glucose range:     Mean/median: 198     Prior  PRE-MEAL Fasting Lunch Dinner 5 PM-12 AM Overall  Glucose range: 150-182      Mean/median: 168 165 131 143    POST-MEAL PC Breakfast PC Lunch PC Dinner  Glucose range:     Mean/median:       Self-care:     Typical meal intake: Breakfast is protein shake or egg and toast               Dietician visit, most recent: 2013               Weight history: Previous range 180-281  Wt Readings from Last 3 Encounters:  08/14/22 210 lb 12.8 oz (95.6 kg)  04/24/22 204 lb 12.8 oz (92.9 kg)  01/16/22  200 lb (90.7 kg)    Glycemic control:    Lab Results  Component Value Date   HGBA1C 8.2 (A) 08/14/2022   HGBA1C 7.8 (A) 04/24/2022   HGBA1C 7.8 (H) 01/16/2022   Lab Results  Component Value Date   MICROALBUR <0.7 01/16/2022   LDLCALC 82 01/16/2022   CREATININE 0.56 01/16/2022     Lab Results  Component Value Date   MICRALBCREAT 1.4 01/16/2022    Lab Results  Component Value Date   FRUCTOSAMINE 255 10/13/2019     Other active problems: See review of systems   Allergies as of 08/14/2022       Reactions   Penicillins Rash        Medication List        Accurate as of August 14, 2022  9:39 AM. If you have any questions, ask your nurse or doctor.          aspirin EC 81 MG tablet Take 81 mg by mouth daily. Stopped over weekend 03/29/2021   atorvastatin 80 MG tablet Commonly known as: LIPITOR TAKE ONE-HALF TABLET BY MOUTH EVERY DAY FOR CHOLESTEROL What changed: See the new instructions.   ezetimibe 10 MG tablet Commonly  known as: ZETIA TAKE 1 TABLET BY MOUTH DAILY. TAKE ALONG WITH ATORVASTATIN   [name] 25 MG Tabs tablet Generic drug: empagliflozin TAKE 1 TABLET BY MOUTH EVERY DAY   L-Lysine 500 MG Caps Take by mouth. Takes one daily   levothyroxine 50 MCG tablet Commonly known as: SYNTHROID TAKE 1 TABLET BY MOUTH EVERY MORNING ON AN EMPTY STOMACH What changed:  when to take this additional instructions   magnesium gluconate 500 MG tablet Commonly known as: MAGONATE Take 500 mg by mouth daily.   metFORMIN 1000 MG tablet Commonly known as: GLUCOPHAGE TAKE 1 TABLET BY MOUTH 2 TIMES A DAY WITH A MEAL   MULTIPLE VITAMIN PO Take by mouth. Takes one vitamin daily   OneTouch Verio test strip Generic drug: glucose blood USE TO TEST BLOOD SUGAR TWICE DAILY DX CODE E11.65   Contour Next Test test strip Generic drug: glucose blood Use Contour test strips as instructed to check blood sugar twice daily.   Soliqua 100-33 UNT-MCG/ML Sopn Generic drug: Insulin Glargine-Lixisenatide INJECT 10 UNITS UNDER THE SKIN IN AM AND 34 UNITS IN THE AFTERNOON What changed: See the new instructions.   Vitamin D3 25 MCG (1000 UT) Caps Take 1,000 Units by mouth 2 (two) times daily. Takes one  twice daily in the evening.        Allergies:  Allergies  Allergen Reactions   Penicillins Rash    Past Medical History:  Diagnosis Date   Allergy    Diabetes mellitus without complication (HCC)    Hyperlipidemia    Hypothyroidism    Thyroid disease    Wears glasses    03/31/2021   Wears glasses    stated on 03/31/2021    Past Surgical History:  Procedure Laterality Date   CESAREAN SECTION     1984 and 1993. 03/31/2021   CHOLECYSTECTOMY     1982. 03/31/2021   DILATION AND CURETTAGE OF UTERUS     missed ab 1988. 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 She has never used smokeless tobacco. She reports that she does not currently use drugs. She reports that she does not drink alcohol.   Review of Systems   Hyperlipidemia: Taking Lipitor 80 mg and using half tablet every other day   Has less muscle aches with reducing her atorvastatin and continues to take Zetia since 2020 Intolerant to Crestor LDL consistently controlled  Labs as follows:   Lab Results  Component Value Date   CHOL 156 01/16/2022   CHOL 158 09/19/2021   CHOL 151 03/20/2021   Lab Results  Component Value Date   HDL 55.80 01/16/2022   HDL 51.40 09/19/2021   HDL 52.30 03/20/2021   Lab Results  Component Value Date   LDLCALC 82 01/16/2022   LDLCALC 86 09/19/2021   LDLCALC 77 03/20/2021   Lab Results  Component Value Date   TRIG 91.0 01/16/2022   TRIG 103.0 09/19/2021   TRIG 109.0 03/20/2021   Lab Results  Component Value Date   CHOLHDL 3 01/16/2022   CHOLHDL 3 09/19/2021   CHOLHDL 3 03/20/2021   No results found for: "LDLDIRECT"          Most recent eye exam was 7/22, no retinopathy  Most recent foot exam: 12/22  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 No recurrence of hypercalcemia  Bone density in 2020 was normal  On 4000 U D3 for supplementation with previously adequate levels  Lab Results  Component Value Date   PTH 12 (L) 02/09/2018   CALCIUM 9.8 01/16/2022   CAION 1.26 04/03/2021     Mild hypothyroidism: She has been on 50 mcg of levothyroxine for several years, not clear what her baseline TSH was  Has been on a stable dose of levothyroxine with normal TSH levels  LABS:   Lab Results   Component Value Date   TSH 2.19 01/16/2022    History of mild neuropathy: No tingling, numbness or pins-and-needles   Only occasional muscle cramps recently   Physical Examination:  BP 124/72 (BP Location: Left Arm, Patient Position: Sitting, Cuff Size: Normal)   Pulse 80   Ht 5\' 3"  (1.6 m)   Wt 210 lb 12.8 oz (95.6 kg)   SpO2 94%   BMI 37.34 kg/m    ASSESSMENT:  Diabetes type 2, with morbid obesity  See history of present illness for detailed discussion of current diabetes management, blood sugar patterns and problems identified  Her A1c is higher at 8.2  Current regimen: Soliqua 35 units at dinnertime, [name] 25 mg and metformin 1 g twice daily  Blood sugars have been higher but mostly related to intercurrent illness and recent lack of exercise from her fatigue Again fasting readings appear to be the highest of the day with only occasional high readings in the evenings Since she has a large supply of Soliqua she does not want any changes yet Also not able to exercise currently  HYPERCHOLESTEROLEMIA: Previously well controlled   Blood pressure again normal  Muscle cramps: Improving  PLAN:   She will need to increase her Soliqua to at 38 units for now and more if needed Continue this at dinnertime  Consider using a GLP-1 drug or Mounjaro along with basal insulin instead of Soliqua if blood sugars are not  well-controlled on the next visit  Discussed differences between [facility] and above recommended drugs  Try to restart exercise when able to  She will try to do better to control snacks late at night  Labs to be checked today including vitamin D level, thyroid and lipids  Follow-up in 3 months   Total visit time including counseling = 30 minutes  There are no Patient Instructions on file for this visit.        Reather Littler 08/14/2022, 9:39 AM   Note: This office note was prepared with Dragon voice recognition system technology. Any  transcriptional errors that result from this process are unintentional.

## 2022-08-31 ENCOUNTER — Encounter: Payer: Self-pay | Admitting: Endocrinology

## 2022-10-05 ENCOUNTER — Other Ambulatory Visit: Payer: Self-pay | Admitting: Endocrinology

## 2022-10-21 ENCOUNTER — Encounter: Payer: Self-pay | Admitting: Endocrinology

## 2022-10-21 ENCOUNTER — Ambulatory Visit: Payer: No Typology Code available for payment source | Admitting: Endocrinology

## 2022-10-21 VITALS — BP 100/62 | HR 79 | Ht 63.0 in | Wt 221.4 lb

## 2022-10-21 DIAGNOSIS — E039 Hypothyroidism, unspecified: Secondary | ICD-10-CM

## 2022-10-21 DIAGNOSIS — R031 Nonspecific low blood-pressure reading: Secondary | ICD-10-CM

## 2022-10-21 DIAGNOSIS — E1165 Type 2 diabetes mellitus with hyperglycemia: Secondary | ICD-10-CM

## 2022-10-21 DIAGNOSIS — R5383 Other fatigue: Secondary | ICD-10-CM | POA: Diagnosis not present

## 2022-10-21 DIAGNOSIS — Z794 Long term (current) use of insulin: Secondary | ICD-10-CM | POA: Diagnosis not present

## 2022-10-21 DIAGNOSIS — E7800 Pure hypercholesterolemia, unspecified: Secondary | ICD-10-CM

## 2022-10-21 LAB — COMPREHENSIVE METABOLIC PANEL
ALT: 32 U/L (ref 0–35)
AST: 26 U/L (ref 0–37)
Albumin: 4.6 g/dL (ref 3.5–5.2)
Alkaline Phosphatase: 80 U/L (ref 39–117)
BUN: 22 mg/dL (ref 6–23)
CO2: 30 mEq/L (ref 19–32)
Calcium: 10 mg/dL (ref 8.4–10.5)
Chloride: 102 mEq/L (ref 96–112)
Creatinine, Ser: 0.54 mg/dL (ref 0.40–1.20)
GFR: 99.27 mL/min (ref >60.00)
Glucose, Bld: 134 mg/dL — ABNORMAL HIGH (ref 70–99)
Potassium: 4.4 mEq/L (ref 3.5–5.1)
Sodium: 138 mEq/L (ref 135–145)
Total Bilirubin: 0.5 mg/dL (ref 0.2–1.2)
Total Protein: 7.6 g/dL (ref 6.0–8.3)

## 2022-10-21 LAB — POCT GLYCOSYLATED HEMOGLOBIN (HGB A1C): Hemoglobin A1C: 8.4 % — AB (ref 4.0–5.6)

## 2022-10-21 LAB — VITAMIN B12: Vitamin B-12: 772 pg/mL (ref 211–911)

## 2022-10-21 LAB — T4, FREE: Free T4: 0.94 ng/dL (ref 0.60–1.60)

## 2022-10-21 LAB — TSH: TSH: 2.83 u[IU]/mL (ref 0.35–5.50)

## 2022-10-21 MED ORDER — TRESIBA FLEXTOUCH 200 UNIT/ML ~~LOC~~ SOPN: 40.0000 [IU] | PEN_INJECTOR | Freq: Every day | SUBCUTANEOUS | 1 refills | Status: DC

## 2022-10-21 MED ORDER — TIRZEPATIDE 2.5 MG/0.5ML ~~LOC~~ SOAJ
2.5000 mg | SUBCUTANEOUS | 0 refills | Status: DC
Start: 2022-10-21 — End: 2023-01-01

## 2022-10-21 MED ORDER — FREESTYLE LIBRE 3 SENSOR MISC: 1.0000 | 2 refills | Status: DC

## 2022-10-21 NOTE — Patient Instructions (Addendum)
Check blood sugars on waking up 6 days a week  Also check blood sugars about 2 hours after meals and do this after different meals by rotation  Recommended blood sugar levels on waking up are 90-130 and about 2 hours after meal is 130-160  Please bring your blood sugar monitor to each visit, thank you    Tresiba insulin: This insulin provides blood sugar control for up to 24 hours.   Start with 40 units at bedtime daily and increase by 2 units every 3 days until the waking up sugars are under 130. Then continue the same dose.  If blood sugar is under 90 for 2 days in a row, reduce the dose by 2 units.   Note that this insulin does not control the rise of blood sugar with meals     Mounjaro 2.5 weekly x 4 shots then 5mg  weekly

## 2022-10-21 NOTE — Progress Notes (Signed)
Patient ID: MALIKIA BERGS, female   DOB: Mar 27, 1961, 62 y.o.   MRN: [id number]          Reason for Appointment: Endocrinology follow-up  Referring physician: Ehinger   History of Present Illness:          Date of diagnosis of type 2 diabetes mellitus: 2004        Background history:   She has been on various oral hyperglycemic agents including metformin, Janumet and Amaryl in the past Apparently her blood sugars were fairly well controlled until 2012 when her A1c was 7.5 and subsequently it has been mostly higher above 8% She has tried other agents including Invokana and Farxiga as well as Bydureon However her A1c has not improved with these changes She apparently had not lost weight with Bydureon and blood sugars were not better  Her baseline A1c was 9.3 in April 2018   Recent history:        Non-insulin hypoglycemic drugs the patient is taking are: Metformin 1000 twice a day, [name] 25 mg daily     INJECTABLE drugs: SOLIQUA 48 units before dinner  Current management, blood sugar patterns and problems identified:  A1c is 8.4 and higher again Previous range 6.4-7.9 recently   She has gone up a total of 10 units on her Willeen Niece since her last visit in January She says that she is trying to watch her diet during the day but gets excessively hungry in the evenings and may get more snacks and larger portions As before is not checking readings after dinner much and not clear why her A1c is higher Her fasting blood sugars are about the same on average as before Highest reading was 230 after eating more sweets the night before Because of her symptoms of fatigue since January she has not done any exercise Her weight has gone up about 11 pounds Checking blood sugars infrequently and only 10 times in the last month, mostly in the morning fasting She did not apparently have coverage for her CGM last year and not clear what GLP-1's are covered She is taking metformin and [name]  as before without side effects  Dinner is usually at 5-6  pm  Side effects from medications have been: None  Compliance with the medical regimen: Fair  Glucose monitoring:  done at least 1  times a day         Glucometer:  Contour     Blood Glucose readings/averages by review of download for the last 30 days   PRE-MEAL Fasting Lunch Dinner Bedtime Overall  Glucose range: 132-230 156, 159     Mean/median: 164       POST-MEAL PC Breakfast PC Lunch PC Dinner  Glucose range:  174 134  Mean/median:      Prior    PRE-MEAL Fasting Lunch Evening Bedtime Overall  Glucose range: 141-195      Mean/median: 160  160  173   POST-MEAL PC Breakfast PC Lunch PC Dinner  Glucose range:     Mean/median: 198     Self-care:     Typical meal intake: Breakfast is protein shake or egg and toast               Dietician visit, most recent: 2013               Weight history: Previous range 180-281  Wt Readings from Last 3 Encounters:  10/21/22 221 lb 6.4 oz (100.4 kg)  08/14/22 210 lb 12.8 oz (  95.6 kg)  04/24/22 204 lb 12.8 oz (92.9 kg)    Glycemic control:    Lab Results  Component Value Date   HGBA1C 8.4 (A) 10/21/2022   HGBA1C 8.2 (A) 08/14/2022   HGBA1C 7.8 (A) 04/24/2022   Lab Results  Component Value Date   MICROALBUR <0.7 01/16/2022   LDLCALC 78 08/14/2022   CREATININE 0.54 10/21/2022     Lab Results  Component Value Date   MICRALBCREAT 1.4 01/16/2022    Lab Results  Component Value Date   FRUCTOSAMINE 255 10/13/2019     Other active problems: See review of systems   Allergies as of 10/21/2022       Reactions   Penicillins Rash   Ezetimibe-simvastatin Other (See Comments)   Pioglitazone    Other Reaction(s): edema (07/2010)        Medication List        Accurate as of October 21, 2022  8:32 PM. If you have any questions, ask your nurse or doctor.          STOP taking these medications    Soliqua 100-33 UNT-MCG/ML Sopn Generic drug: Insulin  Glargine-Lixisenatide Stopped by: Elayne Snare, MD       TAKE these medications    aspirin EC 81 MG tablet Take 81 mg by mouth daily. Stopped over weekend 03/29/2021   atorvastatin 80 MG tablet Commonly known as: LIPITOR TAKE ONE-HALF TABLET BY MOUTH EVERY DAY FOR CHOLESTEROL What changed: See the new instructions.   ezetimibe 10 MG tablet Commonly known as: ZETIA TAKE 1 TABLET BY MOUTH DAILY. TAKE ALONG WITH ATORVASTATIN   FreeStyle Libre 3 Sensor Misc 1 Device by Does not apply route every 14 (fourteen) days. Apply 1 sensor on upper arm every 14 days for continuous glucose monitoring Started by: Elayne Snare, MD   [name] 25 MG Tabs tablet Generic drug: empagliflozin TAKE 1 TABLET BY MOUTH EVERY DAY   L-Lysine 500 MG Caps Take by mouth. Takes one daily   levothyroxine 50 MCG tablet Commonly known as: SYNTHROID TAKE 1 TABLET BY MOUTH EVERY MORNING ON AN EMPTY STOMACH What changed:  when to take this additional instructions   magnesium gluconate 500 MG tablet Commonly known as: MAGONATE Take 500 mg by mouth daily.   metFORMIN 1000 MG tablet Commonly known as: GLUCOPHAGE TAKE 1 TABLET BY MOUTH 2 TIMES A DAY WITH A MEAL   MULTIPLE VITAMIN PO Take by mouth. Takes one vitamin daily   OneTouch Verio test strip Generic drug: glucose blood USE TO TEST BLOOD SUGAR TWICE DAILY DX CODE E11.65   Contour Next Test test strip Generic drug: glucose blood Use Contour test strips as instructed to check blood sugar twice daily.   tirzepatide 2.5 MG/0.5ML Pen Commonly known as: MOUNJARO Inject 2.5 mg into the skin once a week. Started by: Elayne Snare, MD   Tyler Aas FlexTouch 200 UNIT/ML FlexTouch Pen Generic drug: insulin degludec Inject 40 Units into the skin daily. Started by: Elayne Snare, MD   Vitamin D3 25 MCG (1000 UT) capsule Generic drug: Cholecalciferol Take 1,000 Units by mouth 2 (two) times daily. Takes one  twice daily in the evening.        Allergies:   Allergies  Allergen Reactions   Penicillins Rash   Ezetimibe-Simvastatin Other (See Comments)   Pioglitazone     Other Reaction(s): edema (07/2010)    Past Medical History:  Diagnosis Date   Allergy    Diabetes mellitus without complication    Hyperlipidemia  Hypothyroidism    Thyroid disease    Wears glasses    03/31/2021   Wears glasses    stated on 03/31/2021    Past Surgical History:  Procedure Laterality Date   [place] and 1993. 03/31/2021   CHOLECYSTECTOMY     1982. 03/31/2021   DILATION AND CURETTAGE OF UTERUS     missed ab 1988. 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 She has never used smokeless tobacco. She reports that she does not currently use drugs. She reports that she does not drink alcohol.   Review of Systems   Hyperlipidemia: Taking Lipitor 80 mg and using half tablet every other day   Has less muscle aches with reducing her atorvastatin and continues to take Zetia since 2020 Intolerant to Crestor LDL consistently controlled  Labs as follows:   Lab Results  Component Value Date   CHOL 151 08/14/2022   CHOL 156 01/16/2022   CHOL 158 09/19/2021   Lab Results  Component Value Date   HDL 55.50 08/14/2022   HDL 55.80 01/16/2022   HDL 51.40 09/19/2021   Lab Results  Component Value Date   LDLCALC 78 08/14/2022   LDLCALC 82 01/16/2022   LDLCALC 86 09/19/2021   Lab Results  Component Value Date   TRIG 88.0 08/14/2022   TRIG 91.0 01/16/2022   TRIG 103.0  09/19/2021   Lab Results  Component Value Date   CHOLHDL 3 08/14/2022   CHOLHDL 3 01/16/2022   CHOLHDL 3 09/19/2021   No results found for: "LDLDIRECT"          Most recent eye exam was 7/22, no retinopathy  Most recent foot exam: 12/22  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 No recurrence of hypercalcemia  Bone density in 2020 was normal  On 4000 U D3 for supplementation with previously adequate levels  Lab Results  Component Value Date   PTH 12 (L) 02/09/2018   CALCIUM 10.0 10/21/2022   CAION 1.26 04/03/2021     Mild hypothyroidism: She has been on 50 mcg of levothyroxine for several years, not clear what her baseline TSH was  Has been on a stable dose of levothyroxine, not clear if she has had any labs from PCP lately  LABS:   Lab Results  Component Value Date   TSH 2.83 10/21/2022    History of mild neuropathy: No tingling, numbness or pins-and-needles   She complains of fatigue and not clear if she had the labs done by her PCP this year  Physical Examination:  BP 100/62 (BP Location: Left Arm, Patient Position: Sitting, Cuff Size: Normal)   Pulse 79   Ht 5\' 3"  (1.6 m)   Wt 221 lb 6.4 oz (100.4 kg)   SpO2 98%   BMI 39.22 kg/m   Diabetic Foot Exam - Simple   Simple Foot Form Diabetic Foot exam was performed with the following findings: Yes   Visual Inspection No deformities, no ulcerations,  no other skin breakdown bilaterally: Yes Sensation Testing Intact to touch and monofilament testing bilaterally: Yes Pulse Check Posterior Tibialis and Dorsalis pulse intact bilaterally: Yes Comments      ASSESSMENT:  Diabetes type 2, with morbid obesity  See history of present illness for detailed discussion of current diabetes management, blood sugar patterns and problems identified  Her A1c is higher at 8.4  Current regimen: Soliqua 48 units at dinnertime, [name] 25 mg and metformin 1 g twice daily  Blood sugars  are difficult to assess because of inadequate monitoring Although her fasting blood sugars are averaging only about 160 A1c appears to indicate higher readings possibly postprandial Discussed that she has more progression of her diabetes as well as difficulty watching her weight and need to change treatment  Also not going out to exercise currently  HYPERCHOLESTEROLEMIA: Previously well controlled   Blood pressure low normal without any antihypertensives  Hypothyroidism: Needs follow-up TSH Also will check B12 for her fatigue  PLAN:   Stop Soliqua  Will prescribe Mounjaro along with basal insulin instead of Soliqua  Discussed how basal insulin works, timing of injection, dosage, injection sites.   Also discussed titration based on fasting blood sugar every 3 days by 2 units and at target of 90-130 for fasting reading.   Given a flowsheet with instructions on how to keep a record and adjust the doses She will start with 40 units of Tresiba and titrate as above  Discussed with the patient the action of GIP/GLP-1 drugs, the effects on pancreatic and liver function, effects on brain and stomach with improved satiety, slowing gastric emptying, improving satiety and reducing liver glucose output.  Discussed the effects on promoting weight loss. Explained possible side effects of MOUNJARO, most commonly nausea that usually improves over time; discussed safety information in package insert.  Demonstrated the medication injection device and injection technique to the patient.  Discussed the injection site rotation To start with 2.5 mg dosage weekly for the first 4 injections and then increase the dose to 5 mg weekly, she will let us know if doing well at 2.5 mg  Patient brochure on Mounjaro and explained use of the available co-pay card    Discussed differences between [facility] and the newly recommended drugs  Try to restart walking when able to  Glucose monitoring she will check blood  sugars more frequently and also if we can get coverage for the freestyle libre 3 will have her pick up this at the pharmacy and let us know when she needs to start this Discussed targets for after meals readings are  Follow-up in about 2 months  Total visit time including counseling = 30 minutes  Patient Instructions  Check blood sugars on waking up 6 days a week  Also check blood sugars about 2 hours after meals and do this after different meals by rotation  Recommended blood sugar levels on waking up are 90-130 and about 2 hours after meal is 130-160  Please bring your blood sugar monitor to each visit, thank you    Tyler Aas insulin: This insulin provides blood sugar control for up to 24 hours.   Start with 40 units at bedtime daily and increase by 2 units every 3 days until the waking up sugars are under 130. Then continue the same dose.  If blood sugar is under 90 for 2 days in a row, reduce the dose by 2 units.   Note that this insulin does not control the rise of blood  sugar with meals     Mounjaro 2.5 weekly x 4 shots then 5mg  weekly        Elayne Snare 10/21/2022, 8:32 PM   Note: This office note was prepared with Dragon voice recognition system technology. Any transcriptional errors that result from this process are unintentional.   addendum: All labs normal

## 2022-10-22 LAB — CORTISOL: Cortisol, Plasma: 7.3 ug/dL

## 2022-10-23 ENCOUNTER — Other Ambulatory Visit: Payer: Self-pay | Admitting: Endocrinology

## 2022-11-12 ENCOUNTER — Telehealth: Payer: Self-pay | Admitting: Pharmacy Technician

## 2022-11-12 ENCOUNTER — Telehealth: Payer: Self-pay

## 2022-11-12 ENCOUNTER — Other Ambulatory Visit (HOSPITAL_COMMUNITY): Payer: Self-pay

## 2022-11-12 NOTE — Telephone Encounter (Signed)
Mounjaro needs a PA and urgently and they have attempted to send PA 3 times.

## 2022-11-12 NOTE — Telephone Encounter (Signed)
Patient needs to rescheduled appointment   [phone]

## 2022-11-12 NOTE — Telephone Encounter (Signed)
Patient advised.

## 2022-11-12 NOTE — Telephone Encounter (Signed)
Pharmacy Patient Advocate Encounter   Received notification from Pt calls msgs/CMA that prior authorization for [facility] is required/requested.    PA submitted on 11/12/22 to (ins) [employer] via CoverMyMeds Key or [employer]) confirmation # [id number]  Prior Authorization has been approved by [employer] (ins).    PA # PA Case ID: [id number] Effective dates: 11/12/22 through 11/12/23  Spoke with Pharmacy to process. Copay is $100 before copay card.

## 2022-12-01 ENCOUNTER — Ambulatory Visit: Payer: No Typology Code available for payment source | Admitting: Endocrinology

## 2022-12-02 ENCOUNTER — Other Ambulatory Visit: Payer: Self-pay | Admitting: Endocrinology

## 2022-12-03 ENCOUNTER — Other Ambulatory Visit: Payer: Self-pay

## 2022-12-03 MED ORDER — TIRZEPATIDE 5 MG/0.5ML ~~LOC~~ SOAJ: 5.0000 mg | SUBCUTANEOUS | 2 refills | Status: DC

## 2023-01-01 ENCOUNTER — Ambulatory Visit: Payer: No Typology Code available for payment source | Admitting: Endocrinology

## 2023-01-01 VITALS — BP 115/68 | HR 77 | Ht 63.0 in | Wt 214.2 lb

## 2023-01-01 DIAGNOSIS — E1165 Type 2 diabetes mellitus with hyperglycemia: Secondary | ICD-10-CM | POA: Diagnosis not present

## 2023-01-01 DIAGNOSIS — Z794 Long term (current) use of insulin: Secondary | ICD-10-CM | POA: Diagnosis not present

## 2023-01-01 LAB — POCT GLYCOSYLATED HEMOGLOBIN (HGB A1C): Hemoglobin A1C: 6.9 % — AB (ref 4.0–5.6)

## 2023-01-01 NOTE — Progress Notes (Signed)
Patient ID: Kara Dawson, female   DOB: [birth year], 62 y.o.   MRN: [id number]          Reason for Appointment: Endocrinology follow-up  Referring physician: Ehinger   History of Present Illness:          Date of diagnosis of type 2 diabetes mellitus: 2004        Background history:   She has been on various oral hyperglycemic agents including metformin, Janumet and Amaryl in the past Apparently her blood sugars were fairly well controlled until 2012 when her A1c was 7.5 and subsequently it has been mostly higher above 8% She has tried other agents including Invokana and Farxiga as well as Bydureon However her A1c has not improved with these changes She apparently had not lost weight with Bydureon and blood sugars were not better  Her baseline A1c was 9.3 in April 2018   Recent history:        Non-insulin hypoglycemic drugs the patient is taking are: Metformin 1500 a day, [name] 25 mg daily     INJECTABLE drugs: TRESIBA 40 units before bedtime, Mounjaro 5 mg weekly  Current management, blood sugar patterns and problems identified:  A1c is back down to 6.9 compared to 8.4  Previous range 6.4-7.9 recently   She has switched from [place] to [place] on her last visit about 2 months ago She has had 7 doses of Mounjaro and has moved up to 5 mg However she has had nausea along with this although it is slightly better now  With this she has lost 7 pounds  She was given a dose of 40 units of Tresiba and advised to titrate this according to morning sugars and she has not had to change the dose Most of the sugars are within the target range and averaging about 120 in the mornings  Has difficulty remembering to take Tresiba at bedtime but usually does that Also compared to her last visit her blood sugars after dinner are generally better and highest reading is only 163  She did not apparently have coverage for her CGM now even though Josephine Igo was prescribed She is  taking metformin and [name] but asking about reducing the dose  Dinner is usually at 5-6  pm  Side effects from medications have been: nausea  Compliance with the medical regimen: Fair  Glucose monitoring:  done at least 1  times a day         Glucometer:  Contour     Blood Glucose readings/averages by review of download for the last 30 days   PRE-MEAL Fasting Lunch Dinner Bedtime Overall  Glucose range: 91-156      Mean/median: 119    120   POST-MEAL PC Breakfast PC Lunch PC Dinner  Glucose range:   Up to 163  Mean/median:      Previously:  PRE-MEAL Fasting Lunch Dinner Bedtime Overall  Glucose range: 132-230 156, 159     Mean/median: 164       POST-MEAL PC Breakfast PC Lunch PC Dinner  Glucose range:  174 134  Mean/median:       Self-care:     Typical meal intake: Breakfast is protein shake or egg and toast               Dietician visit, most recent: 2013               Weight history: Previous range 180-281  Wt Readings from Last 3 Encounters:  01/01/23 214 lb 3.2 oz (97.2 kg)  10/21/22 221 lb 6.4 oz (100.4 kg)  08/14/22 210 lb 12.8 oz (95.6 kg)    Glycemic control:    Lab Results  Component Value Date   HGBA1C 6.9 (A) 01/01/2023   HGBA1C 8.4 (A) 10/21/2022   HGBA1C 8.2 (A) 08/14/2022   Lab Results  Component Value Date   MICROALBUR <0.7 01/16/2022   LDLCALC 78 08/14/2022   CREATININE 0.54 10/21/2022     Lab Results  Component Value Date   MICRALBCREAT 1.4 01/16/2022    Lab Results  Component Value Date   FRUCTOSAMINE 255 10/13/2019     Other active problems: See review of systems   Allergies as of 01/01/2023       Reactions   Penicillins Rash   Ezetimibe-simvastatin Other (See Comments)   Pioglitazone    Other Reaction(s): edema (07/2010)        Medication List        Accurate as of January 01, 2023  9:33 AM. If you have any questions, ask your nurse or doctor.          aspirin EC 81 MG tablet Take 81 mg by mouth  daily. Stopped over weekend 03/29/2021   atorvastatin 80 MG tablet Commonly known as: LIPITOR TAKE ONE-HALF TABLET BY MOUTH EVERY DAY FOR CHOLESTEROL What changed: See the new instructions.   ezetimibe 10 MG tablet Commonly known as: ZETIA TAKE 1 TABLET BY MOUTH DAILY. TAKE ALONG WITH ATORVASTATIN   FreeStyle Libre 3 Sensor Misc 1 Device by Does not apply route every 14 (fourteen) days. Apply 1 sensor on upper arm every 14 days for continuous glucose monitoring   [name] 25 MG Tabs tablet Generic drug: empagliflozin TAKE 1 TABLET BY MOUTH EVERY DAY   L-Lysine 500 MG Caps Take by mouth. Takes one daily   levothyroxine 50 MCG tablet Commonly known as: SYNTHROID TAKE 1 TABLET BY MOUTH EVERY MORNING ON AN EMPTY STOMACH What changed:  when to take this additional instructions   magnesium gluconate 500 MG tablet Commonly known as: MAGONATE Take 500 mg by mouth daily.   metFORMIN 1000 MG tablet Commonly known as: GLUCOPHAGE TAKE 1 TABLET BY MOUTH TWICE A DAY WITH MEALS   MULTIPLE VITAMIN PO Take by mouth. Takes one vitamin daily   OneTouch Verio test strip Generic drug: glucose blood USE TO TEST BLOOD SUGAR TWICE DAILY DX CODE E11.65   Contour Next Test test strip Generic drug: glucose blood Use Contour test strips as instructed to check blood sugar twice daily.   tirzepatide 2.5 MG/0.5ML Pen Commonly known as: MOUNJARO Inject 2.5 mg into the skin once a week.   tirzepatide 5 MG/0.5ML Pen Commonly known as: MOUNJARO Inject 5 mg into the skin once a week.   Evaristo Bury FlexTouch 200 UNIT/ML FlexTouch Pen Generic drug: insulin degludec INJECT 40 UNITS INTO THE SKIN DAILY   Vitamin D3 25 MCG (1000 UT) Caps Take 1,000 Units by mouth 2 (two) times daily. Takes one  twice daily in the evening.        Allergies:  Allergies  Allergen Reactions   Penicillins Rash   Ezetimibe-Simvastatin Other (See Comments)   Pioglitazone     Other Reaction(s): edema (07/2010)     Past Medical History:  Diagnosis Date   Allergy    Diabetes mellitus without complication (HCC)    Hyperlipidemia    Hypothyroidism    Thyroid disease    Wears glasses    03/31/2021  Wears glasses    stated on 03/31/2021    Past Surgical History:  Procedure Laterality Date   CESAREAN SECTION     1984 and 1993. 03/31/2021   CHOLECYSTECTOMY     1982. 03/31/2021   DILATION AND CURETTAGE OF UTERUS     missed ab 1988. 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 She has never used smokeless tobacco. She reports that she does not currently use drugs. She reports that she does not drink alcohol.   Review of Systems   Hyperlipidemia: Taking Lipitor 80 mg and using half tablet every other day   Has less muscle aches with reducing her atorvastatin and continues to take Zetia since 2020 Intolerant to Crestor LDL consistently controlled  Labs as follows:   Lab Results  Component Value Date   CHOL 151 08/14/2022   CHOL 156 01/16/2022   CHOL 158 09/19/2021   Lab Results  Component Value Date   HDL 55.50 08/14/2022   HDL 55.80 01/16/2022   HDL 51.40 09/19/2021   Lab Results  Component Value Date   LDLCALC 78 08/14/2022   LDLCALC 82 01/16/2022   LDLCALC 86 09/19/2021   Lab Results  Component Value Date   TRIG 88.0 08/14/2022   TRIG 91.0 01/16/2022   TRIG 103.0 09/19/2021   Lab Results  Component Value Date   CHOLHDL 3 08/14/2022   CHOLHDL 3 01/16/2022   CHOLHDL 3 09/19/2021   No results found for:  "LDLDIRECT"   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 No recurrence of hypercalcemia  Bone density in 2020 was normal  On 4000 U D3 for supplementation with previously adequate levels  Lab Results  Component Value Date   PTH 12 (L) 02/09/2018   CALCIUM 10.0 10/21/2022   CAION 1.26 04/03/2021     Mild hypothyroidism: She has been on 50 mcg of levothyroxine for several years, not clear what her baseline TSH was  Has been on a stable dose of levothyroxine, not clear if she has had any labs from PCP lately  LABS:   Lab Results  Component Value Date   TSH 2.83 10/21/2022    History of mild neuropathy: No tingling, numbness or pins-and-needles    Physical Examination:  BP 115/68 (BP Location: Left Arm, Patient Position: Sitting, Cuff Size: Large)   Pulse 77   Ht 5\' 3"  (1.6 m)   Wt 214 lb 3.2 oz (97.2 kg)   SpO2 95%   BMI 37.94 kg/m    ASSESSMENT:  Diabetes type 2, with morbid obesity  See history of present illness for detailed discussion of current diabetes management, blood sugar patterns and problems identified  Her A1c is 6.9, previously was at 8.4  Current regimen: Tresiba 40 units once a day Mounjaro 5 mg weekly,, [name] 25 mg and metformin 1 g twice daily  Blood sugars are much better controlled with adding Mounjaro and her starting to do a little better with portion control as well as lose weight  With this she is requiring less insulin compared to [facility] Also post dinner readings are better   PLAN:    Continue Mounjaro 5 mg weekly but let us know if nausea persists Tresiba 38 units daily May cut down to 36 his morning sugars are still mostly below 110 Stay on metformin and [name] Regular walking for exercise   There are no Patient Instructions on file for this visit.        Reather Littler 01/01/2023, 9:33 AM   Note: This office note was prepared with Dragon voice recognition system technology. Any  transcriptional errors that result from this process are unintentional.   addendum: All labs normal

## 2023-01-01 NOTE — Patient Instructions (Addendum)
Insulin choices: Tou[place]o   Check blood sugars on waking up 7 days a week  Also check blood sugars about 2 hours after meals and do this after different meals by rotation  Recommended blood sugar levels on waking up are 90-130 and about 2 hours after meal is 130-160  Please bring your blood sugar monitor to each visit, thank you

## 2023-02-17 ENCOUNTER — Encounter: Payer: Self-pay | Admitting: Endocrinology

## 2023-02-17 LAB — HM DIABETES EYE EXAM

## 2023-02-24 ENCOUNTER — Other Ambulatory Visit: Payer: Self-pay | Admitting: Endocrinology

## 2023-02-25 ENCOUNTER — Other Ambulatory Visit: Payer: Self-pay | Admitting: Endocrinology

## 2023-03-01 ENCOUNTER — Other Ambulatory Visit (HOSPITAL_COMMUNITY): Payer: Self-pay

## 2023-04-06 ENCOUNTER — Encounter: Payer: Self-pay | Admitting: Endocrinology

## 2023-04-06 ENCOUNTER — Ambulatory Visit: Payer: Managed Care, Other (non HMO) | Admitting: Endocrinology

## 2023-04-06 VITALS — BP 128/70 | HR 77 | Resp 16 | Ht 63.0 in | Wt 215.4 lb

## 2023-04-06 DIAGNOSIS — E7800 Pure hypercholesterolemia, unspecified: Secondary | ICD-10-CM | POA: Diagnosis not present

## 2023-04-06 DIAGNOSIS — E118 Type 2 diabetes mellitus with unspecified complications: Secondary | ICD-10-CM

## 2023-04-06 DIAGNOSIS — E039 Hypothyroidism, unspecified: Secondary | ICD-10-CM | POA: Diagnosis not present

## 2023-04-06 DIAGNOSIS — Z794 Long term (current) use of insulin: Secondary | ICD-10-CM | POA: Diagnosis not present

## 2023-04-06 LAB — MICROALBUMIN / CREATININE URINE RATIO
Creatinine,U: 18.1 mg/dL
Microalb Creat Ratio: 3.9 mg/g (ref 0.0–30.0)
Microalb, Ur: <0.7 mg/dL (ref 0.0–1.9)

## 2023-04-06 LAB — POCT GLYCOSYLATED HEMOGLOBIN (HGB A1C): Hemoglobin A1C: 7 % — AB (ref 4.0–5.6)

## 2023-04-06 NOTE — Progress Notes (Signed)
Outpatient Endocrinology Note [place] Bonnita Newby, MD  04/06/23  Patient's Name: Kara Dawson    DOB: January 14, 1961    MRN: [id number]                                                    REASON OF VISIT: Follow up for type 2 diabetes mellitus  PCP: Blair Heys, MD  HISTORY OF PRESENT ILLNESS:   Kara Dawson is a 62 y.o. old female with past medical history listed below, is here for follow up of type 2 diabetes mellitus / hypothyroidism.    Pertinent Diabetes History: Patient was diagnosed with type 2 diabetes mellitus in 2004.  She had been on various oral antidiabetic medication including metformin, Janumet, Amaryl, Invokana, Farxiga, Bydureon in the past.  She had variable control of diabetes with hemoglobin A1c in the range of 7 to 8% in the past.  Chronic Diabetes Complications : Retinopathy: no. Last ophthalmology exam was done on 01/2023, office note is scanned into media. Nephropathy: no Peripheral neuropathy: no.  History of mild neuropathy. Coronary artery disease: no Stroke: no  Relevant comorbidities and cardiovascular risk factors: Obesity: yes Body mass index is 38.16 kg/m.  Hypertension: yes Hyperlipidemia. Yes, on statin. Intolerant to Crestor. Atorvastatin 80 mg every other day. Ezetimibe 10 mg daily.   Current / Home Diabetic regimen includes: Tresiba 40 units in the evening. Mounjaro 5mg  weekly.  Metformin 1000 mg in the morning and 500 mg in the evening. [name] 25 mg daily.  Prior diabetic medications: Metformin, Janumet, Amaryl, Invokana, Farxiga, [place], [place] in the past.  Glycemic data:    Bethanne's glucose meter is computer downloaded. Raw data and trends analyzed.  Contour.  -  She has been testing her blood glucoses 1 -2 time daily.  -  Average glucose for the last 30 days is 118 mg/dl, range 89 - 132. -Blood sugar 91, 97, 127, 97, 138, 149, 112, 113, 89, 114.  Hypoglycemia: Patient has no hypoglycemic episodes. Patient has hypoglycemia  awareness.  Factors modifying glucose control: 1.  Diabetic diet assessment: Dinner is the largest meal.  During the daytime she eats usually smaller meals.  2.  Staying active or exercising:   3.  Medication compliance: compliant all of the time.  # Primary hypothyroidism -She has been on thyroid hormone replacement, currently taking levothyroxine 50 mcg daily.  # History of hyperparathyroidism, status post parathyroid surgery.  Serum calcium has remained normal with no recurrence of hypercalcemia. -DEXA scan bone density was normal in 2020.  On vitamin D supplement.  Interval history 04/06/23 Glucometer data as reviewed above.  She has no complaints of vision or numbness and tingling of the feet.  She has been taking Mounjaro 5 mg weekly, she was having more nausea but no vomiting and last drink was better for her after Mounjaro.  No other complaints today.  She has been taking levothyroxine 50 mcg daily.  No hypo and hyperthyroid symptoms.  REVIEW OF SYSTEMS As per history of present illness.   PAST MEDICAL HISTORY: Past Medical History:  Diagnosis Date   Allergy    Diabetes mellitus without complication (HCC)    Hyperlipidemia    Hypothyroidism    Thyroid disease    Wears glasses    03/31/2021   Wears glasses    stated on 03/31/2021  PAST SURGICAL HISTORY: Past Surgical History:  Procedure Laterality Date   CESAREAN SECTION     1984 and 1993. 03/31/2021   CHOLECYSTECTOMY     1982. 03/31/2021   DILATION AND CURETTAGE OF UTERUS     missed ab 1988. 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    ALLERGIES: Allergies  Allergen Reactions   Penicillins Rash   Ezetimibe-Simvastatin  Other (See Comments)   Pioglitazone     Other Reaction(s): edema (07/2010)    FAMILY HISTORY:  Family History  Problem Relation Age of Onset   Diabetes Mother    Diabetes Father    Heart disease Father     SOCIAL HISTORY: Social History   Socioeconomic History   Marital status: Married    Spouse name: Not on file   Number of children: Not on file   Years of education: Not on file   Highest education level: Not on file  Occupational History   Not on file  Tobacco Use   Smoking status: Never   Smokeless tobacco: Never  Vaping Use   Vaping status: Never Used  Substance and Sexual Activity   Alcohol use: Never   Drug use: Not Currently   Sexual activity: Not on file  Other Topics Concern   Not on file  Social History Narrative   Not on file   Social Determinants of Health   Financial Resource Strain: Not on file  Food Insecurity: Not on file  Transportation Needs: Not on file  Physical Activity: Not on file  Stress: Not on file  Social Connections: Unknown (11/30/2021)   Received from [facility]   Social Network    Social Network: Not on file    MEDICATIONS:  Current Outpatient Medications  Medication Sig Dispense Refill   aspirin EC 81 MG tablet Take 81 mg by mouth daily. Stopped over weekend 03/29/2021     atorvastatin (LIPITOR) 80 MG tablet TAKE ONE-HALF TABLET BY MOUTH EVERY DAY FOR CHOLESTEROL (Patient taking differently: Take 40 mg by mouth every other day.) 45 tablet 1   Cholecalciferol (VITAMIN D3) 1000 units CAPS Take 1,000 Units by mouth 2 (two) times daily. Takes one  twice daily in the evening.     ezetimibe (ZETIA) 10 MG tablet TAKE 1 TABLET BY MOUTH DAILY. TAKE ALONG WITH ATORVASTATIN 90 tablet 1   glucose blood (CONTOUR NEXT TEST) test strip Use Contour test strips as instructed to check blood sugar twice daily. 100 each 2   glucose blood (ONETOUCH VERIO) test strip USE TO TEST BLOOD SUGAR TWICE DAILY DX CODE E11.65 100 strip 4    insulin degludec (TRESIBA FLEXTOUCH) 200 UNIT/ML FlexTouch Pen INJECT 40 UNITS INTO THE SKIN DAILY 9 mL 1   [name] 25 MG TABS tablet TAKE 1 TABLET BY MOUTH EVERY DAY 30 tablet 3   L-Lysine 500 MG CAPS Take by mouth. Takes one daily     levothyroxine (SYNTHROID) 50 MCG tablet TAKE 1 TABLET BY MOUTH EVERY MORNING ON AN EMPTY STOMACH (Patient taking differently: Take 50 mcg by mouth daily before breakfast.) 90 tablet 1   magnesium gluconate (MAGONATE) 500 MG tablet Take 500 mg by mouth daily.     metFORMIN (GLUCOPHAGE)  1000 MG tablet TAKE 1 TABLET BY MOUTH TWICE A DAY WITH MEALS 180 tablet 3   MULTIPLE VITAMIN PO Take by mouth. Takes one vitamin daily     tirzepatide (MOUNJARO) 5 MG/0.5ML Pen INJECT 5 MG SUBCUTANEOUSLY WEEKLY 2 mL 2   Continuous Blood Gluc Sensor (FREESTYLE LIBRE 3 SENSOR) MISC 1 Device by Does not apply route every 14 (fourteen) days. Apply 1 sensor on upper arm every 14 days for continuous glucose monitoring (Patient not taking: Reported on 04/06/2023) 2 each 2   No current facility-administered medications for this visit.    PHYSICAL EXAM: Vitals:   04/06/23 1042  BP: 128/70  Pulse: 77  Resp: 16  SpO2: 99%  Weight: 215 lb 6.4 oz (97.7 kg)  Height: 5\' 3"  (1.6 m)   Body mass index is 38.16 kg/m.  Wt Readings from Last 3 Encounters:  04/06/23 215 lb 6.4 oz (97.7 kg)  01/01/23 214 lb 3.2 oz (97.2 kg)  10/21/22 221 lb 6.4 oz (100.4 kg)    General: Well developed, well nourished female in no apparent distress.  HEENT: AT/[place], no external lesions.  Eyes: Conjunctiva clear and no icterus. Neck: Neck supple  Lungs: Respirations not labored Neurologic: Alert, oriented, normal speech Extremities / Skin: Dry. No sores or rashes noted.  Psychiatric: Does not appear depressed or anxious  Diabetic Foot Exam - Simple   No data filed    LABS Reviewed Lab Results  Component Value Date   HGBA1C 7.0 (A) 04/06/2023   HGBA1C 6.9 (A) 01/01/2023   HGBA1C 8.4 (A) 10/21/2022    Lab Results  Component Value Date   FRUCTOSAMINE 255 10/13/2019   Lab Results  Component Value Date   CHOL 151 08/14/2022   HDL 55.50 08/14/2022   LDLCALC 78 08/14/2022   TRIG 88.0 08/14/2022   CHOLHDL 3 08/14/2022   Lab Results  Component Value Date   MICRALBCREAT 1.4 01/16/2022   MICRALBCREAT 1.6 03/20/2021   Lab Results  Component Value Date   CREATININE 0.54 10/21/2022   Lab Results  Component Value Date   GFR 99.27 10/21/2022    ASSESSMENT / PLAN  1. Controlled type 2 diabetes mellitus with complication, with long-term current use of insulin (HCC)   2. Adult hypothyroidism   3. Hypercholesterolemia     Diabetes Mellitus type 2, complicated by peripheral neuropathy. - Diabetic status / severity: Controlled.  Lab Results  Component Value Date   HGBA1C 7.0 (A) 04/06/2023    - Hemoglobin A1c goal : <7%  - Medications: See below.  I) trial of decreasing Tresiba to 36 units daily instruction provided, to keep blood sugar 9- 120 range if higher go back to 40 units daily.   II) continue Mounjaro 5 mg weekly. III) change metformin to 500 mg with breakfast and 1000 mg with supper. IV) continue [name] 25 mg daily.  - Home glucose testing: 1-2 times a day in the morning fasting and at bedtime. - Discussed/ Gave Hypoglycemia treatment plan.  # Consult : not required at this time.   # Annual urine for microalbuminuria/ creatinine ratio, no microalbuminuria currently. Last  Lab Results  Component Value Date   MICRALBCREAT 1.4 01/16/2022    # Foot check nightly.  # Annual dilated diabetic eye exams.   - Diet: Make healthy diabetic food choices - Life style / activity / exercise: Discussed.  2. Blood pressure  -  BP Readings from Last 1 Encounters:  04/06/23 128/70    - Control is in  target.  - No change in current plans.  3. Lipid status / Hyperlipidemia - Last  Lab Results  Component Value Date   LDLCALC 78 08/14/2022   - Continue  atorvastatin 80 mg every other day.  # Hypothyroidism -Continue levothyroxine 50 mcg daily. -Annual thyroid lab.  Diagnoses and all orders for this visit:  Controlled type 2 diabetes mellitus with complication, with long-term current use of insulin (HCC) -     Cancel: POCT glycosylated hemoglobin (Hb A1C) -     POCT glycosylated hemoglobin (Hb A1C) -     Microalbumin / creatinine urine ratio; Future -     Microalbumin / creatinine urine ratio  Adult hypothyroidism  Hypercholesterolemia    DISPOSITION Follow up in clinic in 3 months suggested.   All questions answered and patient verbalized understanding of the plan.  [place] Jorrell Kuster, MD [facility] Endocrinology [facility] [address] [place], [address] [place], [place] [address] Phone # [phone]  At least part of this note was generated using voice recognition software. Inadvertent word errors may have occurred, which were not recognized during the proofreading process.

## 2023-04-06 NOTE — Patient Instructions (Addendum)
Try decrease tresiba to 36 units daily as long as fasting glucose remains in 90-120s range, continue on this dose, if glucose higher go upto 40 units.  Metforming 500 mg in the  morning and 1000 mg in the evening.  [name] 25mg  daily.   Mounjaro 5mg  weekly.

## 2023-04-07 ENCOUNTER — Encounter: Payer: Self-pay | Admitting: Endocrinology

## 2023-06-01 ENCOUNTER — Other Ambulatory Visit: Payer: Self-pay

## 2023-06-01 DIAGNOSIS — E118 Type 2 diabetes mellitus with unspecified complications: Secondary | ICD-10-CM

## 2023-06-01 MED ORDER — MOUNJARO 5 MG/0.5ML ~~LOC~~ SOAJ
SUBCUTANEOUS | 2 refills | Status: DC
Start: 2023-06-01 — End: 2023-06-28

## 2023-06-03 ENCOUNTER — Other Ambulatory Visit: Payer: Self-pay

## 2023-06-07 ENCOUNTER — Other Ambulatory Visit: Payer: Self-pay

## 2023-06-07 ENCOUNTER — Other Ambulatory Visit (HOSPITAL_COMMUNITY): Payer: Self-pay

## 2023-06-07 DIAGNOSIS — E118 Type 2 diabetes mellitus with unspecified complications: Secondary | ICD-10-CM

## 2023-06-07 MED ORDER — TRESIBA FLEXTOUCH 200 UNIT/ML ~~LOC~~ SOPN: 40.0000 [IU] | PEN_INJECTOR | Freq: Every day | SUBCUTANEOUS | 1 refills | Status: DC

## 2023-06-28 ENCOUNTER — Other Ambulatory Visit: Payer: Self-pay

## 2023-06-28 DIAGNOSIS — E118 Type 2 diabetes mellitus with unspecified complications: Secondary | ICD-10-CM

## 2023-06-28 MED ORDER — EMPAGLIFLOZIN 25 MG PO TABS
25.0000 mg | ORAL_TABLET | Freq: Every day | ORAL | 3 refills | Status: DC
Start: 2023-06-28 — End: 2023-07-26

## 2023-06-28 MED ORDER — TRESIBA FLEXTOUCH 200 UNIT/ML ~~LOC~~ SOPN: 40.0000 [IU] | PEN_INJECTOR | Freq: Every day | SUBCUTANEOUS | 1 refills | Status: DC

## 2023-06-28 MED ORDER — MOUNJARO 5 MG/0.5ML ~~LOC~~ SOAJ
SUBCUTANEOUS | 2 refills | Status: DC
Start: 2023-06-28 — End: 2023-07-26

## 2023-07-08 ENCOUNTER — Ambulatory Visit: Payer: Managed Care, Other (non HMO) | Admitting: Endocrinology

## 2023-07-19 ENCOUNTER — Ambulatory Visit: Payer: Managed Care, Other (non HMO) | Admitting: Endocrinology

## 2023-07-26 ENCOUNTER — Other Ambulatory Visit: Payer: Self-pay

## 2023-07-26 ENCOUNTER — Encounter: Payer: Self-pay | Admitting: Endocrinology

## 2023-07-26 ENCOUNTER — Ambulatory Visit: Payer: Managed Care, Other (non HMO) | Admitting: Endocrinology

## 2023-07-26 VITALS — BP 140/60 | HR 87 | Resp 20 | Ht 63.0 in | Wt 217.0 lb

## 2023-07-26 DIAGNOSIS — E1165 Type 2 diabetes mellitus with hyperglycemia: Secondary | ICD-10-CM | POA: Diagnosis not present

## 2023-07-26 DIAGNOSIS — Z794 Long term (current) use of insulin: Secondary | ICD-10-CM

## 2023-07-26 LAB — POCT GLYCOSYLATED HEMOGLOBIN (HGB A1C): Hemoglobin A1C: 8.1 % — AB (ref 4.0–5.6)

## 2023-07-26 MED ORDER — MOUNJARO 5 MG/0.5ML ~~LOC~~ SOAJ: SUBCUTANEOUS | 3 refills | Status: DC

## 2023-07-26 MED ORDER — TRESIBA FLEXTOUCH 200 UNIT/ML ~~LOC~~ SOPN: 40.0000 [IU] | PEN_INJECTOR | Freq: Every day | SUBCUTANEOUS | 3 refills | Status: DC

## 2023-07-26 MED ORDER — TRESIBA FLEXTOUCH 200 UNIT/ML ~~LOC~~ SOPN
40.0000 [IU] | PEN_INJECTOR | Freq: Every day | SUBCUTANEOUS | 3 refills | Status: DC
Start: 2023-07-26 — End: 2023-07-26

## 2023-07-26 MED ORDER — EMPAGLIFLOZIN 25 MG PO TABS: 25.0000 mg | ORAL_TABLET | Freq: Every day | ORAL | 3 refills | Status: DC

## 2023-07-26 NOTE — Progress Notes (Signed)
 Outpatient Endocrinology Note Kara Golberg, MD  07/26/23  Patient's Name: Kara Dawson    DOB: [birth year]    MRN: [id number]                                                    REASON OF VISIT: Follow up for type 2 diabetes mellitus  PCP: Hugh Charleston, MD (Inactive)  HISTORY OF PRESENT ILLNESS:   Kara Dawson is a 63 y.o. old female with past medical history listed below, is here for follow up of type 2 diabetes mellitus / hypothyroidism.    Pertinent Diabetes History: Patient was diagnosed with type 2 diabetes mellitus in 2004.  She had been on various oral antidiabetic medication including metformin , Janumet, Amaryl , Invokana , Farxiga , Bydureon in the past.  She had variable control of diabetes with hemoglobin A1c in the range of 7 to 8% in the past.  Chronic Diabetes Complications : Retinopathy: no. Last ophthalmology exam was done on 01/2023, office note is scanned into media. Nephropathy: no Peripheral neuropathy: no.  History of mild neuropathy. Coronary artery disease: no Stroke: no  Relevant comorbidities and cardiovascular risk factors: Obesity: yes Body mass index is 38.44 kg/m.  Hypertension: yes Hyperlipidemia. Yes, on statin. Intolerant to Crestor. Atorvastatin  80 mg every other day. Ezetimibe  10 mg daily.   Current / Home Diabetic regimen includes: Tresiba  40 units in the evening. Mounjaro  5 mg weekly.  Metformin  500 mg in the morning and 1000 mg in the evening. [name]  25 mg daily.  Prior diabetic medications: Metformin , Janumet, Amaryl , Invokana , Farxiga , Bydureon, [place]  in the past.  Glycemic data:   Ascensia Contour next one glucometer, download from December 23 to July 26, 2023 reviewed average blood sugar 146.  Lowest blood sugar 112, highest 209.  She has been checking occasionally.  Some of the other blood sugar 156, 140, 146, 122, 136.  Hypoglycemia: Patient has no hypoglycemic episodes. Patient has hypoglycemia awareness.  Factors  modifying glucose control: 1.  Diabetic diet assessment: Dinner is the largest meal.  During the daytime she eats usually smaller meals.  2.  Staying active or exercising:   3.  Medication compliance: compliant all of the time.  # Primary hypothyroidism -She has been on thyroid  hormone replacement, currently taking levothyroxine  50 mcg daily.  # History of hyperparathyroidism, status post parathyroid  surgery.  Serum calcium  has remained normal with no recurrence of hypercalcemia. -DEXA scan bone density was normal in 2020.  On vitamin D  supplement.  Interval history  Glucometer data as reviewed above.  She reports she ran out of the Mounjaro  and not able to refill for about 3 weeks.  She has been getting mail order Tresiba , Mounjaro  and [name] , ?  From [place].  Hemoglobin A1c worsening 8.1% today.  She reports she did not have good diet during holiday time.  No other complaints today.  REVIEW OF SYSTEMS As per history of present illness.   PAST MEDICAL HISTORY: Past Medical History:  Diagnosis Date   Allergy    Diabetes mellitus without complication (HCC)    Hyperlipidemia    Hypothyroidism    Thyroid  disease    Wears glasses    03/31/2021   Wears glasses    stated on 03/31/2021    PAST SURGICAL HISTORY: Past Surgical History:  Procedure Laterality Date   CESAREAN SECTION  1984 and 1993. 03/31/2021   CHOLECYSTECTOMY     1982. 03/31/2021   DILATION AND CURETTAGE OF UTERUS     missed ab 1988. 03/31/2021   GANGLION CYST EXCISION Right 04/03/2021   Procedure: Right wrist dequervains release, right wrist ganglion excision;  Surgeon: Alyse Agent, MD;  Location: 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    ALLERGIES: Allergies  Allergen Reactions   Penicillins Rash   Ezetimibe -Simvastatin Other (See Comments)   Pioglitazone     Other  Reaction(s): edema (07/2010)    FAMILY HISTORY:  Family History  Problem Relation Age of Onset   Diabetes Mother    Diabetes Father    Heart disease Father     SOCIAL HISTORY: Social History   Socioeconomic History   Marital status: Married    Spouse name: Not on file   Number of children: Not on file   Years of education: Not on file   Highest education level: Not on file  Occupational History   Not on file  Tobacco Use   Smoking status: Never   Smokeless tobacco: Never  Vaping Use   Vaping status: Never Used  Substance and Sexual Activity   Alcohol use: Never   Drug use: Not Currently   Sexual activity: Not on file  Other Topics Concern   Not on file  Social History Narrative   Not on file   Social Drivers of Health   Financial Resource Strain: Not on file  Food Insecurity: Not on file  Transportation Needs: Not on file  Physical Activity: Not on file  Stress: Not on file  Social Connections: Unknown (11/30/2021)   Received from [facility]   Social Network    Social Network: Not on file    MEDICATIONS:  Current Outpatient Medications  Medication Sig Dispense Refill   aspirin EC 81 MG tablet Take 81 mg by mouth daily. Stopped over weekend 03/29/2021     atorvastatin  (LIPITOR) 80 MG tablet TAKE ONE-HALF TABLET BY MOUTH EVERY DAY FOR CHOLESTEROL (Patient taking differently: Take 40 mg by mouth every other day.) 45 tablet 1   Cholecalciferol (VITAMIN D3) 1000 units CAPS Take 1,000 Units by mouth 2 (two) times daily. Takes one  twice daily in the evening.     Continuous Blood Gluc Sensor (FREESTYLE LIBRE 3 SENSOR) MISC 1 Device by Does not apply route every 14 (fourteen) days. Apply 1 sensor on upper arm every 14 days for continuous glucose monitoring 2 each 2   ezetimibe  (ZETIA ) 10 MG tablet TAKE 1 TABLET BY MOUTH DAILY. TAKE ALONG WITH ATORVASTATIN  90 tablet 1   glucose blood (CONTOUR NEXT TEST) test strip Use Contour test strips as instructed  to check blood sugar twice daily. 100 each 2   glucose blood (ONETOUCH VERIO) test strip USE TO TEST BLOOD SUGAR TWICE DAILY DX CODE E11.65 100 strip 4   L-Lysine 500 MG CAPS Take by mouth. Takes one daily     levothyroxine  (SYNTHROID ) 50 MCG tablet TAKE 1 TABLET BY MOUTH EVERY MORNING ON AN EMPTY STOMACH (Patient taking differently: Take 50 mcg by mouth daily before breakfast.) 90 tablet 1   magnesium gluconate (MAGONATE) 500 MG tablet Take 500 mg by mouth daily.     metFORMIN  (GLUCOPHAGE ) 1000 MG tablet TAKE 1 TABLET BY MOUTH TWICE A DAY WITH MEALS 180 tablet 3  MULTIPLE VITAMIN PO Take by mouth. Takes one vitamin daily     empagliflozin  ([name] ) 25 MG TABS tablet Take 1 tablet (25 mg total) by mouth daily. 90 tablet 3   insulin  degludec (TRESIBA  FLEXTOUCH) 200 UNIT/ML FlexTouch Pen Inject 40 Units into the skin daily. 18 mL 3   tirzepatide  (MOUNJARO ) 5 MG/0.5ML Pen INJECT 5MG  SUBCUTANEOUSLY WEEKLY 6 mL 3   No current facility-administered medications for this visit.    PHYSICAL EXAM: Vitals:   07/26/23 0957 07/26/23 0958  BP: (!) 144/62 (!) 140/60  Pulse: 87   Resp: 20   SpO2: 96%   Weight: 217 lb (98.4 kg)   Height: 5' 3 (1.6 m)     Body mass index is 38.44 kg/m.  Wt Readings from Last 3 Encounters:  07/26/23 217 lb (98.4 kg)  04/06/23 215 lb 6.4 oz (97.7 kg)  01/01/23 214 lb 3.2 oz (97.2 kg)    General: Well developed, well nourished female in no apparent distress.  HEENT: AT/[place], no external lesions.  Eyes: Conjunctiva clear and no icterus. Neck: Neck supple  Lungs: Respirations not labored Neurologic: Alert, oriented, normal speech Extremities / Skin: Dry. No sores or rashes noted.  Psychiatric: Does not appear depressed or anxious  Diabetic Foot Exam - Simple   No data filed    LABS Reviewed Lab Results  Component Value Date   HGBA1C 8.1 (A) 07/26/2023   HGBA1C 7.0 (A) 04/06/2023   HGBA1C 6.9 (A) 01/01/2023   Lab Results  Component Value Date    FRUCTOSAMINE 255 10/13/2019   Lab Results  Component Value Date   CHOL 151 08/14/2022   HDL 55.50 08/14/2022   LDLCALC 78 08/14/2022   TRIG 88.0 08/14/2022   CHOLHDL 3 08/14/2022   Lab Results  Component Value Date   MICRALBCREAT 3.9 04/06/2023   MICRALBCREAT 1.4 01/16/2022   Lab Results  Component Value Date   CREATININE 0.54 10/21/2022   Lab Results  Component Value Date   GFR 99.27 10/21/2022    ASSESSMENT / PLAN  1. Type 2 diabetes mellitus with hyperglycemia, with long-term current use of insulin  (HCC)   2. Uncontrolled type 2 diabetes mellitus with hyperglycemia (HCC)      Diabetes Mellitus type 2, complicated by peripheral neuropathy. - Diabetic status / severity: Controlled.  Lab Results  Component Value Date   HGBA1C 8.1 (A) 07/26/2023    - Hemoglobin A1c goal : <7%  - Medications: See below.  No change.  She recently restarted Mounjaro .  I) continue Tresiba   40 units daily.  Paper prescription will be faxed to the pharmacy provided by patient. II) continue Mounjaro  5 mg weekly. III) Continue metformin  500 mg with breakfast and 1000 mg with supper. IV) continue [name]  25 mg daily.  - Home glucose testing: 1-2 times a day in the morning fasting and at bedtime. - Discussed/ Gave Hypoglycemia treatment plan.  # Consult : not required at this time.   # Annual urine for microalbuminuria/ creatinine ratio, no microalbuminuria currently. Last  Lab Results  Component Value Date   MICRALBCREAT 3.9 04/06/2023    # Foot check nightly.  # Annual dilated diabetic eye exams.   - Diet: Make healthy diabetic food choices - Life style / activity / exercise: Discussed.  2. Blood pressure  -  BP Readings from Last 1 Encounters:  07/26/23 (!) 140/60    - Control is in target.  - No change in current plans.  3. Lipid status / Hyperlipidemia -  Last  Lab Results  Component Value Date   LDLCALC 78 08/14/2022   - Continue atorvastatin  80 mg every  other day.  # Hypothyroidism -Continue levothyroxine  50 mcg daily. -Annual thyroid  lab.  Diagnoses and all orders for this visit:  Type 2 diabetes mellitus with hyperglycemia, with long-term current use of insulin  (HCC) -     POCT glycosylated hemoglobin (Hb A1C) -     Discontinue: insulin  degludec (TRESIBA  FLEXTOUCH) 200 UNIT/ML FlexTouch Pen; Inject 40 Units into the skin daily. -     empagliflozin  ([name] ) 25 MG TABS tablet; Take 1 tablet (25 mg total) by mouth daily. -     tirzepatide  (MOUNJARO ) 5 MG/0.5ML Pen; INJECT 5MG  SUBCUTANEOUSLY WEEKLY -     Discontinue: insulin  degludec (TRESIBA  FLEXTOUCH) 200 UNIT/ML FlexTouch Pen; Inject 40 Units into the skin daily.  Uncontrolled type 2 diabetes mellitus with hyperglycemia (HCC)     DISPOSITION Follow up in clinic in 3 months suggested.   All questions answered and patient verbalized understanding of the plan.  Wende Longstreth, MD [facility] Endocrinology [facility] [address] [place], [address] [place], [place] [address] Phone # [phone]  At least part of this note was generated using voice recognition software. Inadvertent word errors may have occurred, which were not recognized during the proofreading process.

## 2023-07-27 ENCOUNTER — Other Ambulatory Visit: Payer: Self-pay | Admitting: Endocrinology

## 2023-07-27 DIAGNOSIS — Z794 Long term (current) use of insulin: Secondary | ICD-10-CM

## 2023-10-22 ENCOUNTER — Ambulatory Visit: Payer: Managed Care, Other (non HMO) | Admitting: Endocrinology

## 2023-10-22 ENCOUNTER — Encounter: Payer: Self-pay | Admitting: Endocrinology

## 2023-10-22 ENCOUNTER — Other Ambulatory Visit: Payer: Self-pay

## 2023-10-22 VITALS — BP 132/62 | HR 76 | Resp 16 | Ht 63.0 in | Wt 218.6 lb

## 2023-10-22 DIAGNOSIS — E7800 Pure hypercholesterolemia, unspecified: Secondary | ICD-10-CM

## 2023-10-22 DIAGNOSIS — Z794 Long term (current) use of insulin: Secondary | ICD-10-CM

## 2023-10-22 DIAGNOSIS — E118 Type 2 diabetes mellitus with unspecified complications: Secondary | ICD-10-CM

## 2023-10-22 DIAGNOSIS — E1165 Type 2 diabetes mellitus with hyperglycemia: Secondary | ICD-10-CM

## 2023-10-22 DIAGNOSIS — E039 Hypothyroidism, unspecified: Secondary | ICD-10-CM

## 2023-10-22 LAB — BASIC METABOLIC PANEL WITHOUT GFR
BUN/Creatinine Ratio: 60 (calc) — ABNORMAL HIGH (ref 6–22)
BUN: 29 mg/dL — ABNORMAL HIGH (ref 7–25)
CO2: 32 mmol/L (ref 20–32)
Calcium: 9.7 mg/dL (ref 8.6–10.4)
Chloride: 103 mmol/L (ref 98–110)
Creat: 0.48 mg/dL — ABNORMAL LOW (ref 0.50–1.05)
Glucose, Bld: 87 mg/dL (ref 65–99)
Potassium: 4.9 mmol/L (ref 3.5–5.3)
Sodium: 141 mmol/L (ref 135–146)

## 2023-10-22 LAB — POCT GLYCOSYLATED HEMOGLOBIN (HGB A1C): Hemoglobin A1C: 7 % — AB (ref 4.0–5.6)

## 2023-10-22 LAB — LIPID PANEL
Cholesterol: 163 mg/dL (ref <200)
HDL: 57 mg/dL (ref >=50)
LDL Cholesterol (Calc): 86 mg/dL
Non-HDL Cholesterol (Calc): 106 mg/dL (ref <130)
Total CHOL/HDL Ratio: 2.9 (calc) (ref <5.0)
Triglycerides: 104 mg/dL (ref <150)

## 2023-10-22 LAB — TSH: TSH: 3.17 m[IU]/L (ref 0.40–4.50)

## 2023-10-22 LAB — T4, FREE: Free T4: 1.3 ng/dL (ref 0.8–1.8)

## 2023-10-22 MED ORDER — TRESIBA FLEXTOUCH 200 UNIT/ML ~~LOC~~ SOPN: 40.0000 [IU] | PEN_INJECTOR | Freq: Every day | SUBCUTANEOUS | 3 refills | Status: AC

## 2023-10-22 MED ORDER — TIRZEPATIDE 7.5 MG/0.5ML ~~LOC~~ SOAJ: 7.5000 mg | SUBCUTANEOUS | 4 refills | Status: DC

## 2023-10-22 MED ORDER — EMPAGLIFLOZIN 25 MG PO TABS: 25.0000 mg | ORAL_TABLET | Freq: Every day | ORAL | 3 refills | Status: AC

## 2023-10-22 MED ORDER — TRESIBA FLEXTOUCH 200 UNIT/ML ~~LOC~~ SOPN: 40.0000 [IU] | PEN_INJECTOR | Freq: Every day | SUBCUTANEOUS | 3 refills | Status: DC

## 2023-10-22 NOTE — Progress Notes (Signed)
 Outpatient Endocrinology Note [place] Jakiya Bookbinder, MD  10/22/23  Patient's Name: Kara Dawson    DOB: August 24, 1960    MRN: [id number]                                                    REASON OF VISIT: Follow up for type 2 diabetes mellitus  PCP: Blair Heys, MD (Inactive)  HISTORY OF PRESENT ILLNESS:   Kara Dawson is a 63 y.o. old female with past medical history listed below, is here for follow up of type 2 diabetes mellitus / hypothyroidism.    Pertinent Diabetes History: Patient was diagnosed with type 2 diabetes mellitus in 2004.  She had been on various oral antidiabetic medication including metformin, Janumet, Amaryl, Invokana, Farxiga, Bydureon in the past.  She had variable control of diabetes with hemoglobin A1c in the range of 7 to 8% in the past.  Chronic Diabetes Complications : Retinopathy: no. Last ophthalmology exam was done on 01/2023, office note is scanned into media. Nephropathy: no Peripheral neuropathy: no.  History of mild neuropathy. Coronary artery disease: no Stroke: no  Relevant comorbidities and cardiovascular risk factors: Obesity: yes Body mass index is 38.72 kg/m.  Hypertension: yes Hyperlipidemia. Yes, on statin. Intolerant to Crestor. Atorvastatin 80 mg every other day. Ezetimibe 10 mg daily.   Current / Home Diabetic regimen includes: Tresiba 40 units in the evening. Mounjaro 5 mg weekly.  Metformin 500 mg in the morning and 1000 mg in the evening. [name] 25 mg daily.  Prior diabetic medications: Metformin, Janumet, Amaryl, Invokana, Farxiga, Bydureon, [place] in the past.  Glycemic data:   Ascensia Contour next one glucometer, download from March 21 to October 22, 2023 reviewed average blood sugar 126.  Lowest blood sugar 90, highest 175.  Fasting blood sugar 109101, 141, 90, 120, 130, 175, 157.  Blood sugar in the afternoon and evening 111, 123, 131, 115.  Mostly acceptable.    Hypoglycemia: Patient has no hypoglycemic episodes. Patient  has hypoglycemia awareness.  Factors modifying glucose control: 1.  Diabetic diet assessment: Dinner is the largest meal.  During the daytime she eats usually smaller meals.  2.  Staying active or exercising:   3.  Medication compliance: compliant all of the time.  # Primary hypothyroidism -She has been on thyroid hormone replacement, currently taking levothyroxine 50 mcg daily.  # History of hyperparathyroidism, status post parathyroid surgery.  Serum calcium has remained normal with no recurrence of hypercalcemia. -DEXA scan bone density was normal in 2020.  On vitamin D supplement.  Interval history  Glucometer data as reviewed above.  Mostly acceptable.  Hemoglobin A1c improved to 7%.  Diabetes regimen as reviewed and noted above.  She has a right hand trigger finger following with orthopedic.  She requested to fax prescriptions for Ihor Gully and London Pepper to the medicine supplier.   REVIEW OF SYSTEMS As per history of present illness.   PAST MEDICAL HISTORY: Past Medical History:  Diagnosis Date   Allergy    Diabetes mellitus without complication (HCC)    Hyperlipidemia    Hypothyroidism    Thyroid disease    Wears glasses    03/31/2021   Wears glasses    stated on 03/31/2021    PAST SURGICAL HISTORY: Past Surgical History:  Procedure Laterality Date   CESAREAN SECTION  1984 and 1993. 03/31/2021   CHOLECYSTECTOMY     1982. 03/31/2021   DILATION AND CURETTAGE OF UTERUS     missed ab 1988. 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    ALLERGIES: Allergies  Allergen Reactions   Penicillins Rash   Ezetimibe-Simvastatin Other (See Comments)   Pioglitazone     Other  Reaction(s): edema (07/2010)    FAMILY HISTORY:  Family History  Problem Relation Age of Onset   Diabetes Mother    Diabetes Father    Heart disease Father     SOCIAL HISTORY: Social History   Socioeconomic History   Marital status: Married    Spouse name: Not on file   Number of children: Not on file   Years of education: Not on file   Highest education level: Not on file  Occupational History   Not on file  Tobacco Use   Smoking status: Never   Smokeless tobacco: Never  Vaping Use   Vaping status: Never Used  Substance and Sexual Activity   Alcohol use: Never   Drug use: Not Currently   Sexual activity: Not on file  Other Topics Concern   Not on file  Social History Narrative   Not on file   Social Drivers of Health   Financial Resource Strain: Not on file  Food Insecurity: Not on file  Transportation Needs: Not on file  Physical Activity: Not on file  Stress: Not on file  Social Connections: Unknown (11/30/2021)   Received from [facility], [facility]   Social Network    Social Network: Not on file    MEDICATIONS:  Current Outpatient Medications  Medication Sig Dispense Refill   aspirin EC 81 MG tablet Take 81 mg by mouth daily. Stopped over weekend 03/29/2021     atorvastatin (LIPITOR) 80 MG tablet TAKE ONE-HALF TABLET BY MOUTH EVERY DAY FOR CHOLESTEROL (Patient taking differently: Take 40 mg by mouth every other day.) 45 tablet 1   Cholecalciferol (VITAMIN D3) 1000 units CAPS Take 1,000 Units by mouth 2 (two) times daily. Takes one  twice daily in the evening.     ezetimibe (ZETIA) 10 MG tablet TAKE 1 TABLET BY MOUTH DAILY. TAKE ALONG WITH ATORVASTATIN 90 tablet 1   glucose blood (CONTOUR NEXT TEST) test strip Use Contour test strips as instructed to check blood sugar twice daily. 100 each 2   levothyroxine (SYNTHROID) 50 MCG tablet TAKE 1 TABLET BY MOUTH EVERY MORNING ON AN EMPTY STOMACH (Patient taking differently: Take 50 mcg by mouth daily before  breakfast.) 90 tablet 1   magnesium gluconate (MAGONATE) 500 MG tablet Take 500 mg by mouth daily.     meloxicam (MOBIC) 15 MG tablet Take 15 mg by mouth daily.     metFORMIN (GLUCOPHAGE) 1000 MG tablet TAKE 1 TABLET BY MOUTH TWICE A DAY WITH MEALS 180 tablet 3   MULTIPLE VITAMIN PO Take by mouth. Takes one vitamin daily     empagliflozin ([name]) 25 MG TABS tablet Take 1 tablet (25 mg total) by mouth daily. 90 tablet 3   glucose blood (ONETOUCH VERIO) test strip USE TO TEST BLOOD SUGAR TWICE DAILY DX CODE E11.65 100 strip 4   insulin degludec (  TRESIBA FLEXTOUCH) 200 UNIT/ML FlexTouch Pen Inject 40 Units into the skin daily. 18 mL 3   L-Lysine 500 MG CAPS Take by mouth. Takes one daily (Patient not taking: Reported on 10/22/2023)     tirzepatide (MOUNJARO) 7.5 MG/0.5ML Pen Inject 7.5 mg into the skin once a week. 6 mL 4   No current facility-administered medications for this visit.    PHYSICAL EXAM: Vitals:   10/22/23 0823  BP: 132/62  Pulse: 76  Resp: 16  SpO2: 98%  Weight: 218 lb 9.6 oz (99.2 kg)  Height: 5\' 3"  (1.6 m)     Body mass index is 38.72 kg/m.  Wt Readings from Last 3 Encounters:  10/22/23 218 lb 9.6 oz (99.2 kg)  07/26/23 217 lb (98.4 kg)  04/06/23 215 lb 6.4 oz (97.7 kg)    General: Well developed, well nourished female in no apparent distress.  HEENT: AT/[place], no external lesions.  Eyes: Conjunctiva clear and no icterus. Neck: Neck supple  Lungs: Respirations not labored Neurologic: Alert, oriented, normal speech Extremities / Skin: Dry. No sores or rashes noted.  Psychiatric: Does not appear depressed or anxious  Diabetic Foot Exam - Simple   Simple Foot Form Diabetic Foot exam was performed with the following findings: Yes 10/22/2023  8:40 AM  Visual Inspection No deformities, no ulcerations, no other skin breakdown bilaterally: Yes Sensation Testing Intact to touch and monofilament testing bilaterally: Yes Pulse Check Posterior Tibialis and Dorsalis  pulse intact bilaterally: Yes Comments    LABS Reviewed Lab Results  Component Value Date   HGBA1C 7.0 (A) 10/22/2023   HGBA1C 8.1 (A) 07/26/2023   HGBA1C 7.0 (A) 04/06/2023   Lab Results  Component Value Date   FRUCTOSAMINE 255 10/13/2019   Lab Results  Component Value Date   CHOL 151 08/14/2022   HDL 55.50 08/14/2022   LDLCALC 78 08/14/2022   TRIG 88.0 08/14/2022   CHOLHDL 3 08/14/2022   Lab Results  Component Value Date   MICRALBCREAT 3.9 04/06/2023   MICRALBCREAT 1.4 01/16/2022   Lab Results  Component Value Date   CREATININE 0.54 10/21/2022   Lab Results  Component Value Date   GFR 99.27 10/21/2022    ASSESSMENT / PLAN  1. Type 2 diabetes mellitus with hyperglycemia, with long-term current use of insulin (HCC)   2. Adult hypothyroidism   3. Hypercholesterolemia   4. Uncontrolled type 2 diabetes mellitus with hyperglycemia (HCC)     Diabetes Mellitus type 2, complicated by peripheral neuropathy. - Diabetic status / severity: Fair control, improving.  Lab Results  Component Value Date   HGBA1C 7.0 (A) 10/22/2023    - Hemoglobin A1c goal : <6.5%  - Medications: See below.     I) continue Tresiba  40 units daily.  Paper prescription  faxed to the pharmacy provided by patient. II) increased Mounjaro 5 mg weekly, 2 separate 5 mg weekly. III) Continue metformin 500 mg with breakfast and 1000 mg with supper. IV) continue [name] 25 mg daily.  With increasing Mounjaro if patient starts to have blood sugar persistently in 70 or 80 range fasting in the early morning , asked to decrease Tresiba to 36 units.  Paper prescription  faxed to the pharmacy provided by patient, for Ihor Gully and [name].  She gets these medications with financial assistance.  - Home glucose testing: 1-2 times a day in the morning fasting and at bedtime. - Discussed/ Gave Hypoglycemia treatment plan.  # Consult : not required at this time.   #  Annual urine for  microalbuminuria/ creatinine ratio, no microalbuminuria currently.  Check BMP today. Last  Lab Results  Component Value Date   MICRALBCREAT 3.9 04/06/2023    # Foot check nightly.  # Annual dilated diabetic eye exams.   - Diet: Make healthy diabetic food choices - Life style / activity / exercise: Discussed.  2. Blood pressure  -  BP Readings from Last 1 Encounters:  10/22/23 132/62    - Control is in target.  - No change in current plans.  3. Lipid status / Hyperlipidemia - Last  Lab Results  Component Value Date   LDLCALC 78 08/14/2022   - Continue atorvastatin 80 mg every other day. -Will check lipid panel today.  # Hypothyroidism -Continue levothyroxine 50 mcg daily. -Check TSH, free T4 today.  Annual thyroid lab.  Diagnoses and all orders for this visit:  Type 2 diabetes mellitus with hyperglycemia, with long-term current use of insulin (HCC) -     POCT glycosylated hemoglobin (Hb A1C) -     Basic Metabolic Panel Without GFR -     Discontinue: tirzepatide (MOUNJARO) 7.5 MG/0.5ML Pen; Inject 7.5 mg into the skin once a week. -     empagliflozin ([name]) 25 MG TABS tablet; Take 1 tablet (25 mg total) by mouth daily.  Adult hypothyroidism -     T4, free -     TSH  Hypercholesterolemia -     Lipid panel  Uncontrolled type 2 diabetes mellitus with hyperglycemia (HCC) -     Discontinue: insulin degludec (TRESIBA FLEXTOUCH) 200 UNIT/ML FlexTouch Pen; Inject 40 Units into the skin daily.    DISPOSITION Follow up in clinic in 3 months suggested.  Labs today as ordered.   All questions answered and patient verbalized understanding of the plan.  [place] Dawnisha Marquina, MD [facility] Endocrinology [facility] [address] [place], [address] [place], [place] [address] Phone # [phone]  At least part of this note was generated using voice recognition software. Inadvertent word errors may have occurred, which were not recognized during the proofreading  process.

## 2023-10-27 NOTE — Telephone Encounter (Signed)
 Yeah that elevated BUN is usually happen due to dehydration I would recommend to will hydrate and avoid dehydration.

## 2023-12-16 ENCOUNTER — Other Ambulatory Visit: Payer: Self-pay

## 2023-12-16 DIAGNOSIS — E1165 Type 2 diabetes mellitus with hyperglycemia: Secondary | ICD-10-CM

## 2023-12-16 MED ORDER — METFORMIN HCL 1000 MG PO TABS: 1000.0000 mg | ORAL_TABLET | Freq: Two times a day (BID) | ORAL | 3 refills | Status: AC

## 2024-02-24 ENCOUNTER — Encounter: Payer: Self-pay | Admitting: Endocrinology

## 2024-02-24 ENCOUNTER — Other Ambulatory Visit: Payer: Self-pay

## 2024-02-24 ENCOUNTER — Ambulatory Visit: Payer: Self-pay | Admitting: Endocrinology

## 2024-02-24 ENCOUNTER — Ambulatory Visit: Admitting: Endocrinology

## 2024-02-24 VITALS — BP 118/60 | HR 88 | Resp 20 | Ht 63.0 in | Wt 211.6 lb

## 2024-02-24 DIAGNOSIS — E039 Hypothyroidism, unspecified: Secondary | ICD-10-CM | POA: Diagnosis not present

## 2024-02-24 DIAGNOSIS — E7800 Pure hypercholesterolemia, unspecified: Secondary | ICD-10-CM

## 2024-02-24 DIAGNOSIS — E1165 Type 2 diabetes mellitus with hyperglycemia: Secondary | ICD-10-CM | POA: Diagnosis not present

## 2024-02-24 DIAGNOSIS — Z794 Long term (current) use of insulin: Secondary | ICD-10-CM

## 2024-02-24 LAB — POCT GLYCOSYLATED HEMOGLOBIN (HGB A1C): Hemoglobin A1C: 7.3 % — AB (ref 4.0–5.6)

## 2024-02-24 MED ORDER — TIRZEPATIDE 10 MG/0.5ML ~~LOC~~ SOAJ: 10.0000 mg | SUBCUTANEOUS | 3 refills | Status: AC

## 2024-02-24 MED ORDER — EZETIMIBE 10 MG PO TABS: 10.0000 mg | ORAL_TABLET | Freq: Every day | ORAL | 3 refills | Status: AC

## 2024-02-24 MED ORDER — TIRZEPATIDE 10 MG/0.5ML ~~LOC~~ SOAJ: 10.0000 mg | SUBCUTANEOUS | 3 refills | Status: DC

## 2024-02-24 MED ORDER — ATORVASTATIN CALCIUM 40 MG PO TABS: 40.0000 mg | ORAL_TABLET | ORAL | 3 refills | Status: AC

## 2024-02-24 MED ORDER — LEVOTHYROXINE SODIUM 50 MCG PO TABS: 50.0000 ug | ORAL_TABLET | Freq: Every morning | ORAL | 3 refills | Status: AC

## 2024-02-24 NOTE — Progress Notes (Signed)
 Outpatient Endocrinology Note Kara Esaias Cleavenger, MD  02/24/24  Patient's Name: Kara Dawson    DOB: [birth year]    MRN: [id number]                                                    REASON OF VISIT: Follow up for type 2 diabetes mellitus  PCP: Hugh Charleston, MD (Inactive)  HISTORY OF PRESENT ILLNESS:   Kara Dawson is a 63 y.o. old female with past medical history listed below, is here for follow up of type 2 diabetes mellitus / hypothyroidism.    Pertinent Diabetes History: Patient was diagnosed with type 2 diabetes mellitus in 2004.  She had been on various oral antidiabetic medication including metformin , Janumet, Amaryl , Invokana , Farxiga , Bydureon in the past.  She had variable control of diabetes with hemoglobin A1c in the range of 7 to 8% in the past.  Chronic Diabetes Complications : Retinopathy: no. Last ophthalmology exam was done on today, office note is scanned into media. Nephropathy: no Peripheral neuropathy: no.  History of mild neuropathy. Coronary artery disease: no Stroke: no  Relevant comorbidities and cardiovascular risk factors: Obesity: yes Body mass index is 37.48 kg/m.  Hypertension: yes Hyperlipidemia. Yes, on statin. Intolerant to Crestor. Atorvastatin  80 mg every other day. Ezetimibe  10 mg daily.   Current / Home Diabetic regimen includes: Tresiba  40 units in the evening. Mounjaro  7.5 mg weekly.  Metformin  500 mg in the morning and 1000 mg in the evening. [name]  25 mg daily.  Prior diabetic medications: Metformin , Janumet, Amaryl , Invokana , Farxiga , Bydureon, Soliqua  in the past.  Glycemic data:   Ascensia Contour next one glucometer, download from July 24 to August 7 , 2025 reviewed average blood sugar 114.  Lowest blood sugar 73, highest 164.  Fasting blood sugar 121, 101, 122, 100, and 130, 139, 92.  Some of the blood sugar in 109, 130, 106.  Hypoglycemia: Patient has no hypoglycemic episodes. Patient has hypoglycemia  awareness.  Factors modifying glucose control: 1.  Diabetic diet assessment: Dinner is the largest meal.  During the daytime she eats usually smaller meals.  2.  Staying active or exercising:   3.  Medication compliance: compliant all of the time.  # Primary hypothyroidism -She has been on thyroid  hormone replacement, currently taking levothyroxine  50 mcg daily.  # History of hyperparathyroidism, status post parathyroid  surgery.  Serum calcium  has remained normal with no recurrence of hypercalcemia. -DEXA scan bone density was normal in 2020.  On vitamin D  supplement.  Interval history  Glucometer data as reviewed above.  Mostly acceptable blood sugar.  Hemoglobin A1c 7.3% today slightly increased.  Diabetes regimen is reviewed and noted above.  She has been taking Mounjaro  7.5 mg weekly and tolerating well.  She has no other complaints today.  She reports she had diabetic eye exam today.  REVIEW OF SYSTEMS As per history of present illness.   PAST MEDICAL HISTORY: Past Medical History:  Diagnosis Date   Allergy    Diabetes mellitus without complication (HCC)    Hyperlipidemia    Hypothyroidism    Thyroid  disease    Wears glasses    03/31/2021   Wears glasses    stated on 03/31/2021    PAST SURGICAL HISTORY: Past Surgical History:  Procedure Laterality Date   CESAREAN SECTION  1984 and 1993. 03/31/2021   CHOLECYSTECTOMY     1982. 03/31/2021   DILATION AND CURETTAGE OF UTERUS     missed ab 1988. 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    ALLERGIES: Allergies  Allergen Reactions   Penicillins Rash   Ezetimibe -Simvastatin Other (See Comments)   Pioglitazone     Other  Reaction(s): edema (07/2010)    FAMILY HISTORY:  Family History  Problem Relation Age of Onset   Diabetes Mother    Diabetes Father    Heart disease Father     SOCIAL HISTORY: Social History   Socioeconomic History   Marital status: Married    Spouse name: Not on file   Number of children: Not on file   Years of education: Not on file   Highest education level: Not on file  Occupational History   Not on file  Tobacco Use   Smoking status: Never   Smokeless tobacco: Never  Vaping Use   Vaping status: Never Used  Substance and Sexual Activity   Alcohol use: Never   Drug use: Not Currently   Sexual activity: Not on file  Other Topics Concern   Not on file  Social History Narrative   Not on file   Social Drivers of Health   Financial Resource Strain: Not on file  Food Insecurity: Not on file  Transportation Needs: Not on file  Physical Activity: Not on file  Stress: Not on file  Social Connections: Unknown (11/30/2021)   Received from [facility]   Social Network    Social Network: Not on file    MEDICATIONS:  Current Outpatient Medications  Medication Sig Dispense Refill   aspirin EC 81 MG tablet Take 81 mg by mouth daily. Stopped over weekend 03/29/2021     Cholecalciferol (VITAMIN D3) 1000 units CAPS Take 1,000 Units by mouth 2 (two) times daily. Takes one  twice daily in the evening.     empagliflozin  ([name] ) 25 MG TABS tablet Take 1 tablet (25 mg total) by mouth daily. 90 tablet 3   glucose blood (CONTOUR NEXT TEST) test strip Use Contour test strips as instructed to check blood sugar twice daily. 100 each 2   insulin  degludec (TRESIBA  FLEXTOUCH) 200 UNIT/ML FlexTouch Pen Inject 40 Units into the skin daily. 18 mL 3   L-Lysine 500 MG CAPS Take by mouth. Takes one daily     magnesium gluconate (MAGONATE) 500 MG tablet Take 500 mg by mouth daily.     meloxicam (MOBIC) 15 MG tablet Take 15 mg by mouth daily.     metFORMIN  (GLUCOPHAGE ) 1000 MG tablet Take  1 tablet (1,000 mg total) by mouth 2 (two) times daily with a meal. (Patient taking differently: Take 1,000 mg by mouth 2 (two) times daily with a meal. Patient takes a 1/2 tab in the morning and whole tab in the evening) 180 tablet 3   MULTIPLE VITAMIN PO Take by mouth. Takes one vitamin daily     atorvastatin  (LIPITOR) 40 MG tablet Take 1 tablet (40 mg total) by mouth every other day. 45 tablet 3   ezetimibe  (ZETIA ) 10 MG tablet Take 1 tablet (10 mg total) by mouth daily. 90 tablet 3  glucose blood (ONETOUCH VERIO) test strip USE TO TEST BLOOD SUGAR TWICE DAILY DX CODE E11.65 100 strip 4   levothyroxine  (SYNTHROID ) 50 MCG tablet Take 1 tablet (50 mcg total) by mouth every morning. on an empty stomach 90 tablet 3   tirzepatide  (MOUNJARO ) 10 MG/0.5ML Pen Inject 10 mg into the skin once a week. 6 mL 3   No current facility-administered medications for this visit.    PHYSICAL EXAM: Vitals:   02/24/24 1309  BP: 118/60  Pulse: 88  Resp: 20  SpO2: 98%  Weight: 211 lb 9.6 oz (96 kg)  Height: 5' 3 (1.6 m)      Body mass index is 37.48 kg/m.  Wt Readings from Last 3 Encounters:  02/24/24 211 lb 9.6 oz (96 kg)  10/22/23 218 lb 9.6 oz (99.2 kg)  07/26/23 217 lb (98.4 kg)    General: Well developed, well nourished female in no apparent distress.  HEENT: AT/[place], no external lesions.  Eyes: Conjunctiva clear and no icterus. Neck: Neck supple  Lungs: Respirations not labored Neurologic: Alert, oriented, normal speech Extremities / Skin: Dry. Psychiatric: Does not appear depressed or anxious  Diabetic Foot Exam - Simple   No data filed    LABS Reviewed Lab Results  Component Value Date   HGBA1C 7.3 (A) 02/24/2024   HGBA1C 7.0 (A) 10/22/2023   HGBA1C 8.1 (A) 07/26/2023   Lab Results  Component Value Date   FRUCTOSAMINE 255 10/13/2019   Lab Results  Component Value Date   CHOL 163 10/22/2023   HDL 57 10/22/2023   LDLCALC 86 10/22/2023   TRIG 104 10/22/2023   CHOLHDL 2.9  10/22/2023   No results found for: [facility]  Lab Results  Component Value Date   CREATININE 0.48 (L) 10/22/2023   Lab Results  Component Value Date   GFR 99.27 10/21/2022    ASSESSMENT / PLAN  1. Type 2 diabetes mellitus with hyperglycemia, with long-term current use of insulin  (HCC)   2. Adult hypothyroidism   3. Hypercholesterolemia      Diabetes Mellitus type 2, complicated by peripheral neuropathy. - Diabetic status / severity: Fair control, improving.  Lab Results  Component Value Date   HGBA1C 7.3 (A) 02/24/2024    - Hemoglobin A1c goal : <6.5%  Mostly acceptable blood sugar on glucometer.  - Medications: See below.     I) continue Tresiba   40 units daily.   II) increase Mounjaro  7.5 to 10 mg weekly.  Prescription will be faxed to medical supply. III) Continue metformin  500 mg with breakfast and 1000 mg with supper. IV) continue [name]  25 mg daily.  With increasing Mounjaro  if patient starts to have blood sugar persistently in 70 or 80 range fasting in the early morning , asked to decrease Tresiba  to 36 units.  Note: Paper prescription to the pharmacy provided by patient, for Mounjaro , Tresiba  and [name] .  She gets these medications with financial assistance.  - Home glucose testing: 1-2 times a day in the morning fasting and at bedtime. - Discussed/ Gave Hypoglycemia treatment plan.  # Consult : not required at this time.   # Annual urine for microalbuminuria/ creatinine ratio, no microalbuminuria currently.  Will check urine microalbumin creatinine ratio today. Last  No results found for: MICRALBCREAT   # Foot check nightly.  # Annual dilated diabetic eye exams.   - Diet: Make healthy diabetic food choices - Life style / activity / exercise: Discussed.  2. Blood pressure  -  BP Readings from  Last 1 Encounters:  02/24/24 118/60    - Control is in target.  - No change in current plans.  3. Lipid status / Hyperlipidemia - Last   Lab Results  Component Value Date   LDLCALC 86 10/22/2023   - Continue atorvastatin  40 mg every other day.  # Hypothyroidism -Continue levothyroxine  50 mcg daily.   Diagnoses and all orders for this visit:  Type 2 diabetes mellitus with hyperglycemia, with long-term current use of insulin  (HCC) -     POCT glycosylated hemoglobin (Hb A1C) -     Discontinue: tirzepatide  (MOUNJARO ) 10 MG/0.5ML Pen; Inject 10 mg into the skin once a week. -     Microalbumin / creatinine urine ratio -     tirzepatide  (MOUNJARO ) 10 MG/0.5ML Pen; Inject 10 mg into the skin once a week.  Adult hypothyroidism -     levothyroxine  (SYNTHROID ) 50 MCG tablet; Take 1 tablet (50 mcg total) by mouth every morning. on an empty stomach  Hypercholesterolemia -     ezetimibe  (ZETIA ) 10 MG tablet; Take 1 tablet (10 mg total) by mouth daily. -     atorvastatin  (LIPITOR) 40 MG tablet; Take 1 tablet (40 mg total) by mouth every other day.   DISPOSITION Follow up in clinic in 4 months suggested.     All questions answered and patient verbalized understanding of the plan.  Kara Charma Mocarski, MD [facility] Endocrinology [facility] [address] [place], [address] [place], [place] [address] Phone # [phone]  At least part of this note was generated using voice recognition software. Inadvertent word errors may have occurred, which were not recognized during the proofreading process.

## 2024-02-25 LAB — MICROALBUMIN / CREATININE URINE RATIO
Creatinine, Urine: 56 mg/dL (ref 20–275)
Microalb Creat Ratio: 7 mg/g{creat} (ref <30)
Microalb, Ur: 0.4 mg/dL

## 2024-06-19 ENCOUNTER — Telehealth: Payer: Self-pay

## 2024-06-19 NOTE — Telephone Encounter (Signed)
 Patient request labs be sent to facility insurance will cover.

## 2024-06-30 ENCOUNTER — Ambulatory Visit: Payer: Self-pay | Admitting: Endocrinology

## 2024-06-30 ENCOUNTER — Ambulatory Visit: Admitting: Endocrinology

## 2024-06-30 ENCOUNTER — Encounter: Payer: Self-pay | Admitting: Endocrinology

## 2024-06-30 ENCOUNTER — Other Ambulatory Visit

## 2024-06-30 VITALS — BP 132/62 | HR 77 | Resp 16 | Ht 63.0 in | Wt 210.2 lb

## 2024-06-30 DIAGNOSIS — Z9889 Other specified postprocedural states: Secondary | ICD-10-CM

## 2024-06-30 DIAGNOSIS — Z8639 Personal history of other endocrine, nutritional and metabolic disease: Secondary | ICD-10-CM

## 2024-06-30 DIAGNOSIS — Z794 Long term (current) use of insulin: Secondary | ICD-10-CM | POA: Diagnosis not present

## 2024-06-30 DIAGNOSIS — E7800 Pure hypercholesterolemia, unspecified: Secondary | ICD-10-CM | POA: Diagnosis not present

## 2024-06-30 DIAGNOSIS — E559 Vitamin D deficiency, unspecified: Secondary | ICD-10-CM

## 2024-06-30 DIAGNOSIS — E039 Hypothyroidism, unspecified: Secondary | ICD-10-CM

## 2024-06-30 DIAGNOSIS — E1165 Type 2 diabetes mellitus with hyperglycemia: Secondary | ICD-10-CM | POA: Diagnosis not present

## 2024-06-30 LAB — POCT GLYCOSYLATED HEMOGLOBIN (HGB A1C): Hemoglobin A1C: 7.4 % — AB (ref 4.0–5.6)

## 2024-06-30 NOTE — Patient Instructions (Signed)
 Latest Reference Range & Units 07/26/23 09:59 10/22/23 08:27 02/24/24 13:15 06/30/24 08:53  Hemoglobin A1C 4.0 - 5.6 % -  8.1 ! Pend 7.0 ! Pend 7.3 ! Pend 7.4 ! Pend  !: Data is abnormal

## 2024-06-30 NOTE — Progress Notes (Signed)
 Outpatient Endocrinology Note Andrina Locken, MD  06/30/2024  Patient's Name: Kara Dawson    DOB: [birth year]    MRN: [id number]                                                    REASON OF VISIT: Follow up for type 2 diabetes mellitus /hypothyroidism.  PCP: Hugh Charleston, MD (Inactive)  HISTORY OF PRESENT ILLNESS:   Kara Dawson is a 63 y.o. old female with past medical history listed below, is here for follow up of type 2 diabetes mellitus / hypothyroidism.    Pertinent Diabetes History: Patient was diagnosed with type 2 diabetes mellitus in 2004.  She had been on various oral antidiabetic medication including metformin , Janumet, Amaryl , Invokana , Farxiga , Bydureon in the past.  She had variable control of diabetes with hemoglobin A1c in the range of 7 to 8% in the past.  Chronic Diabetes Complications : Retinopathy: no. Last ophthalmology exam was done on today, office note is scanned into media. Nephropathy: no Peripheral neuropathy: no.  History of mild neuropathy. Coronary artery disease: no Stroke: no  Relevant comorbidities and cardiovascular risk factors: Obesity: yes Body mass index is 37.24 kg/m.  Hypertension: yes Hyperlipidemia. Yes, on statin. Intolerant to Crestor. Atorvastatin  80 mg every other day. Ezetimibe  10 mg daily.   Current / Home Diabetic regimen includes:  Tresiba  40 units in the evening. Mounjaro  10 mg weekly.  Metformin  500 mg in the morning and 1000 mg in the evening. [name]  25 mg daily.  Prior diabetic medications: Metformin , Janumet, Amaryl , Invokana , Farxiga , Bydureon, Soliqua  in the past.  Glycemic data:   Ascensia Contour next one glucometer, download from J November 28 to June 30, 2024 reviewed average blood sugar 133.  She has been mostly checking blood sugar in the morning fasting, blood sugar 112, 151, 144, 152, 115, 126.   Hypoglycemia: Patient has no hypoglycemic episodes. Patient has hypoglycemia awareness.  Factors  modifying glucose control: 1.  Diabetic diet assessment: Dinner is the largest meal.  During the daytime she eats usually smaller meals.  2.  Staying active or exercising:   3.  Medication compliance: compliant all of the time.  # Primary hypothyroidism -She has been on thyroid  hormone replacement, currently taking levothyroxine  50 mcg daily.  # History of hyperparathyroidism, status post parathyroid  surgery.  Serum calcium  has remained normal with no recurrence of hypercalcemia. -DEXA scan bone density was normal in 2020.  On vitamin D  supplement.  Interval history  Glucometer data as reviewed above.  Hemoglobin A1c staying about the same 7.4%.  Diabetes regimen as reviewed and noted above.  She recently increase Mounjaro  has taken 2 doses so far of 10 mg weekly.  Denies GI issues regarding nausea or vomiting.  She lost about 10 pounds of weight in last 6 months.  She occasionally uses high carb meal otherwise trying to limit her carbohydrates.  No other complaints today.  REVIEW OF SYSTEMS As per history of present illness.   PAST MEDICAL HISTORY: Past Medical History:  Diagnosis Date   Allergy    Diabetes mellitus without complication (HCC)    Hyperlipidemia    Hypothyroidism    Thyroid  disease    Wears glasses    03/31/2021   Wears glasses    stated on 03/31/2021    PAST SURGICAL HISTORY:  Past Surgical History:  Procedure Laterality Date   CESAREAN SECTION     1984 and 1993. 03/31/2021   CHOLECYSTECTOMY     1982. 03/31/2021   DILATION AND CURETTAGE OF UTERUS     missed ab 1988. 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    ALLERGIES: Allergies  Allergen Reactions   Penicillins  Rash   Ezetimibe -Simvastatin Other (See Comments)   Pioglitazone     Other Reaction(s): edema (07/2010)    FAMILY HISTORY:  Family History  Problem Relation Age of Onset   Diabetes Mother    Diabetes Father    Heart disease Father     SOCIAL HISTORY: Social History   Socioeconomic History   Marital status: Married    Spouse name: Not on file   Number of children: Not on file   Years of education: Not on file   Highest education level: Not on file  Occupational History   Not on file  Tobacco Use   Smoking status: Never   Smokeless tobacco: Never  Vaping Use   Vaping status: Never Used  Substance and Sexual Activity   Alcohol use: Never   Drug use: Not Currently   Sexual activity: Not on file  Other Topics Concern   Not on file  Social History Narrative   Not on file   Social Drivers of Health   Tobacco Use: Low Risk (06/30/2024)   Patient History    Smoking Tobacco Use: Never    Smokeless Tobacco Use: Never    Passive Exposure: Not on file  Financial Resource Strain: Not on file  Food Insecurity: Not on file  Transportation Needs: Not on file  Physical Activity: Not on file  Stress: Not on file  Social Connections: Not on file  Depression (EYV7-0): Not on file  Alcohol Screen: Not on file  Housing: Not on file  Utilities: Not on file  Health Literacy: Not on file    MEDICATIONS:  Current Outpatient Medications  Medication Sig Dispense Refill   aspirin EC 81 MG tablet Take 81 mg by mouth daily. Stopped over weekend 03/29/2021     atorvastatin  (LIPITOR) 40 MG tablet Take 1 tablet (40 mg total) by mouth every other day. 45 tablet 3   Cholecalciferol (VITAMIN D3) 1000 units CAPS Take 1,000 Units by mouth 2 (two) times daily. Takes one  twice daily in the evening.     empagliflozin  ([name] ) 25 MG TABS tablet Take 1 tablet (25 mg total) by mouth daily. 90 tablet 3   ezetimibe  (ZETIA ) 10 MG tablet Take 1 tablet (10 mg total) by mouth daily. 90 tablet 3    glucose blood (CONTOUR NEXT TEST) test strip Use Contour test strips as instructed to check blood sugar twice daily. 100 each 2   insulin  degludec (TRESIBA  FLEXTOUCH) 200 UNIT/ML FlexTouch Pen Inject 40 Units into the skin daily. 18 mL 3   L-Lysine 500 MG CAPS Take by mouth. Takes one daily     levothyroxine  (SYNTHROID ) 50 MCG tablet Take 1 tablet (50 mcg total) by mouth every morning. on an empty stomach 90 tablet 3   magnesium gluconate (MAGONATE) 500 MG tablet Take 500 mg by mouth daily.  meloxicam (MOBIC) 15 MG tablet Take 15 mg by mouth daily.     metFORMIN  (GLUCOPHAGE ) 1000 MG tablet Take 1 tablet (1,000 mg total) by mouth 2 (two) times daily with a meal. (Patient taking differently: Take 1,000 mg by mouth 2 (two) times daily with a meal. Patient takes a 1/2 tab in the morning and whole tab in the evening) 180 tablet 3   MULTIPLE VITAMIN PO Take by mouth. Takes one vitamin daily     tirzepatide  (MOUNJARO ) 10 MG/0.5ML Pen Inject 10 mg into the skin once a week. 6 mL 3   glucose blood (ONETOUCH VERIO) test strip USE TO TEST BLOOD SUGAR TWICE DAILY DX CODE E11.65 100 strip 4   No current facility-administered medications for this visit.    PHYSICAL EXAM: Vitals:   06/30/24 0845  BP: 132/62  Pulse: 77  Resp: 16  SpO2: 98%  Weight: 210 lb 3.2 oz (95.3 kg)  Height: 5' 3 (1.6 m)      Body mass index is 37.24 kg/m.  Wt Readings from Last 3 Encounters:  06/30/24 210 lb 3.2 oz (95.3 kg)  02/24/24 211 lb 9.6 oz (96 kg)  10/22/23 218 lb 9.6 oz (99.2 kg)    General: Well developed, well nourished female in no apparent distress.  HEENT: AT/[place], no external lesions.  Eyes: Conjunctiva clear and no icterus. Neck: Neck supple  Lungs: Respirations not labored Neurologic: Alert, oriented, normal speech Extremities / Skin: Dry. Psychiatric: Does not appear depressed or anxious  Diabetic Foot Exam - Simple   No data filed    LABS Reviewed Lab Results  Component Value Date    HGBA1C 7.4 (A) 06/30/2024   HGBA1C 7.3 (A) 02/24/2024   HGBA1C 7.0 (A) 10/22/2023   Lab Results  Component Value Date   FRUCTOSAMINE 255 10/13/2019   Lab Results  Component Value Date   CHOL 163 10/22/2023   HDL 57 10/22/2023   LDLCALC 86 10/22/2023   TRIG 104 10/22/2023   CHOLHDL 2.9 10/22/2023   Lab Results  Component Value Date   MICRALBCREAT 7 02/24/2024    Lab Results  Component Value Date   CREATININE 0.48 (L) 10/22/2023   Lab Results  Component Value Date   GFR 99.27 10/21/2022    ASSESSMENT / PLAN  1. Type 2 diabetes mellitus with hyperglycemia, with long-term current use of insulin  (HCC)   2. Adult hypothyroidism   3. Hypercholesterolemia   4. Vitamin D  deficiency   5. History of hyperparathyroidism   6. History of parathyroid  surgery      Diabetes Mellitus type 2, complicated by peripheral neuropathy. - Diabetic status / severity: Fair control  Lab Results  Component Value Date   HGBA1C 7.4 (A) 06/30/2024    - Hemoglobin A1c goal : <6.5%  Mostly acceptable blood sugar on glucometer.  - Medications: See below.     I) continue Tresiba   40 units daily.   II) continue Mounjaro  10 mg weekly.  Recently increased dose.  Asked to call our clinic after a month if she feels comfortable in terms of side effect dose can be increased gradually. III) Continue metformin  500 mg with breakfast and 1000 mg with supper. IV) continue [name]  25 mg daily.  Note: Paper prescription to the pharmacy require, for Mounjaro , Tresiba  and [name] .  She gets these medications with financial assistance.  - Home glucose testing: 1-2 times a day in the morning fasting and at bedtime. - Discussed/ Gave Hypoglycemia treatment plan.  # Consult :  not required at this time.   # Annual urine for microalbuminuria/ creatinine ratio, no microalbuminuria currently.   Last  Lab Results  Component Value Date   MICRALBCREAT 7 02/24/2024     # Foot check nightly.  # Annual  dilated diabetic eye exams.   - Diet: Make healthy diabetic food choices.  Advised to limit carbohydrate and portion control. - Life style / activity / exercise: Discussed.  2. Blood pressure  -  BP Readings from Last 1 Encounters:  06/30/24 132/62    - Control is in target.  - No change in current plans.  3. Lipid status / Hyperlipidemia - Last  Lab Results  Component Value Date   LDLCALC 86 10/22/2023   - Continue atorvastatin  40 mg every other day.  # Hypothyroidism -Continue levothyroxine  50 mcg daily.  # History of hyperparathyroidism status post parathyroid  surgery # History of vitamin D  deficiency - Currently taking vitamin D  supplement twice a day, does not recall the dose. - Check BMP with PTH and vitamin D  level prior to follow-up visit.  Labs prior to follow-up visit.  Patient wants to complete with [facility], orders provided.   Diagnoses and all orders for this visit:  Type 2 diabetes mellitus with hyperglycemia, with long-term current use of insulin  (HCC) -     POCT glycosylated hemoglobin (Hb A1C) -     Basic metabolic panel with GFR -     Hemoglobin A1c  Adult hypothyroidism  Hypercholesterolemia -     Lipid panel  Vitamin D  deficiency -     VITAMIN D  25 Hydroxy (Vit-D Deficiency, Fractures)  History of hyperparathyroidism -     Parathyroid  hormone, intact (no Ca)  History of parathyroid  surgery -     Parathyroid  hormone, intact (no Ca)   DISPOSITION Follow up in clinic in 4 months suggested.  Labs prior to follow-up visit as ordered.   All questions answered and patient verbalized understanding of the plan.  Khalik Pewitt, MD [facility] Endocrinology [facility] [address] [place], [address] [place], [place] [address] Phone # [phone]  At least part of this note was generated using voice recognition software. Inadvertent word errors may have occurred, which were not recognized during the proofreading process.

## 2024-11-03 ENCOUNTER — Ambulatory Visit: Admitting: Endocrinology
